# Patient Record
Sex: Female | Born: 2000 | Race: White | Hispanic: No | Marital: Single | Smoking: Never smoker
Health system: Southern US, Community
[De-identification: ages and names within clinical notes are randomized; demographics above are authoritative.]

---
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
## 2021-01-15 ENCOUNTER — Other Ambulatory Visit: Payer: Self-pay

## 2021-01-15 ENCOUNTER — Ambulatory Visit (INDEPENDENT_AMBULATORY_CARE_PROVIDER_SITE_OTHER): Payer: PRIVATE HEALTH INSURANCE | Admitting: Family Medicine

## 2021-01-15 ENCOUNTER — Encounter: Payer: Self-pay | Admitting: Family Medicine

## 2021-01-15 VITALS — BP 139/79 | HR 91 | Temp 99.4°F | Ht 63.5 in | Wt 161.2 lb

## 2021-01-15 DIAGNOSIS — Z7689 Persons encountering health services in other specified circumstances: Secondary | ICD-10-CM | POA: Diagnosis not present

## 2021-01-15 DIAGNOSIS — G43009 Migraine without aura, not intractable, without status migrainosus: Secondary | ICD-10-CM | POA: Diagnosis not present

## 2021-01-15 MED ORDER — RIZATRIPTAN BENZOATE 10 MG PO TABS: 10.0000 mg | ORAL_TABLET | ORAL | 6 refills | Status: AC | PRN

## 2021-01-15 NOTE — Patient Instructions (Signed)
Migraine Headache A migraine headache is an intense, throbbing pain on one side or both sides of the head. Migraine headaches may also cause other symptoms, such as nausea, vomiting, and sensitivity to light and noise. A migraine headache can last from 4 hours to 3 days. Talk with your doctor about what things may bring on (trigger) your migraine headaches. What are the causes? The exact cause of this condition is not known. However, a migraine may be caused when nerves in the brain become irritated and release chemicals that cause inflammation of blood vessels. This inflammation causes pain. This condition may be triggered or caused by:  Drinking alcohol.  Smoking.  Taking medicines, such as: ? Medicine used to treat chest pain (nitroglycerin). ? Birth control pills. ? Estrogen. ? Certain blood pressure medicines.  Eating or drinking products that contain nitrates, glutamate, aspartame, or tyramine. Aged cheeses, chocolate, or caffeine may also be triggers.  Doing physical activity. Other things that may trigger a migraine headache include:  Menstruation.  Pregnancy.  Hunger.  Stress.  Lack of sleep or too much sleep.  Weather changes.  Fatigue. What increases the risk? The following factors may make you more likely to experience migraine headaches:  Being a certain age. This condition is more common in people who are 25-55 years old.  Being female.  Having a family history of migraine headaches.  Being Caucasian.  Having a mental health condition, such as depression or anxiety.  Being obese. What are the signs or symptoms? The main symptom of this condition is pulsating or throbbing pain. This pain may:  Happen in any area of the head, such as on one side or both sides.  Interfere with daily activities.  Get worse with physical activity.  Get worse with exposure to bright lights or loud noises. Other symptoms may  include:  Nausea.  Vomiting.  Dizziness.  General sensitivity to bright lights, loud noises, or smells. Before you get a migraine headache, you may get warning signs (an aura). An aura may include:  Seeing flashing lights or having blind spots.  Seeing bright spots, halos, or zigzag lines.  Having tunnel vision or blurred vision.  Having numbness or a tingling feeling.  Having trouble talking.  Having muscle weakness. Some people have symptoms after a migraine headache (postdromal phase), such as:  Feeling tired.  Difficulty concentrating. How is this diagnosed? A migraine headache can be diagnosed based on:  Your symptoms.  A physical exam.  Tests, such as: ? CT scan or an MRI of the head. These imaging tests can help rule out other causes of headaches. ? Taking fluid from the spine (lumbar puncture) and analyzing it (cerebrospinal fluid analysis, or CSF analysis). How is this treated? This condition may be treated with medicines that:  Relieve pain.  Relieve nausea.  Prevent migraine headaches. Treatment for this condition may also include:  Acupuncture.  Lifestyle changes like avoiding foods that trigger migraine headaches.  Biofeedback.  Cognitive behavioral therapy. Follow these instructions at home: Medicines  Take over-the-counter and prescription medicines only as told by your health care provider.  Ask your health care provider if the medicine prescribed to you: ? Requires you to avoid driving or using heavy machinery. ? Can cause constipation. You may need to take these actions to prevent or treat constipation:  Drink enough fluid to keep your urine pale yellow.  Take over-the-counter or prescription medicines.  Eat foods that are high in fiber, such as beans, whole grains, and   fresh fruits and vegetables.  Limit foods that are high in fat and processed sugars, such as fried or sweet foods. Lifestyle  Do not drink alcohol.  Do not  use any products that contain nicotine or tobacco, such as cigarettes, e-cigarettes, and chewing tobacco. If you need help quitting, ask your health care provider.  Get at least 8 hours of sleep every night.  Find ways to manage stress, such as meditation, deep breathing, or yoga. General instructions  Keep a journal to find out what may trigger your migraine headaches. For example, write down: ? What you eat and drink. ? How much sleep you get. ? Any change to your diet or medicines.  If you have a migraine headache: ? Avoid things that make your symptoms worse, such as bright lights. ? It may help to lie down in a dark, quiet room. ? Do not drive or use heavy machinery. ? Ask your health care provider what activities are safe for you while you are experiencing symptoms.  Keep all follow-up visits as told by your health care provider. This is important.      Contact a health care provider if:  You develop symptoms that are different or more severe than your usual migraine headache symptoms.  You have more than 15 headache days in one month. Get help right away if:  Your migraine headache becomes severe.  Your migraine headache lasts longer than 72 hours.  You have a fever.  You have a stiff neck.  You have vision loss.  Your muscles feel weak or like you cannot control them.  You start to lose your balance often.  You have trouble walking.  You faint.  You have a seizure. Summary  A migraine headache is an intense, throbbing pain on one side or both sides of the head. Migraines may also cause other symptoms, such as nausea, vomiting, and sensitivity to light and noise.  This condition may be treated with medicines and lifestyle changes. You may also need to avoid certain things that trigger a migraine headache.  Keep a journal to find out what may trigger your migraine headaches.  Contact your health care provider if you have more than 15 headache days in a  month or you develop symptoms that are different or more severe than your usual migraine headache symptoms. This information is not intended to replace advice given to you by your health care provider. Make sure you discuss any questions you have with your health care provider. Document Revised: 03/12/2019 Document Reviewed: 12/31/2018 Elsevier Patient Education  2021 [employer] General Headache Without Cause A headache is pain or discomfort felt around the head or neck area. The specific cause of a headache may not be found. There are many causes and types of headaches. A few common ones are:  Tension headaches.  Migraine headaches.  Cluster headaches.  Chronic daily headaches. Follow these instructions at home: Watch your condition for any changes. Let your health care provider know about them. Take these steps to help with your condition: Managing pain  Take over-the-counter and prescription medicines only as told by your health care provider.  Lie down in a dark, quiet room when you have a headache.  If directed, put ice on your head and neck area: ? Put ice in a plastic bag. ? Place a towel between your skin and the bag. ? Leave the ice on for 20 minutes, 2-3 times per day.  If directed, apply heat to the affected  area. Use the heat source that your health care provider recommends, such as a moist heat pack or a heating pad. ? Place a towel between your skin and the heat source. ? Leave the heat on for 20-30 minutes. ? Remove the heat if your skin turns bright red. This is especially important if you are unable to feel pain, heat, or cold. You may have a greater risk of getting burned.  Keep lights dim if bright lights bother you or make your headaches worse.      Eating and drinking  Eat meals on a regular schedule.  If you drink alcohol: ? Limit how much you use to:  0-1 drink a day for women.  0-2 drinks a day for men. ? Be aware of how much alcohol is in your  drink. In the [place] one drink equals one 12 oz bottle of beer (355 mL), one 5 oz glass of wine (148 mL), or one 1 oz glass of hard liquor (44 mL).  Stop drinking caffeine, or decrease the amount of caffeine you drink. General instructions  Keep a headache journal to help find out what may trigger your headaches. For example, write down: ? What you eat and drink. ? How much sleep you get. ? Any change to your diet or medicines.  Try massage or other relaxation techniques.  Limit stress.  Sit up straight, and do not tense your muscles.  Do not use any products that contain nicotine or tobacco, such as cigarettes, e-cigarettes, and chewing tobacco. If you need help quitting, ask your health care provider.  Exercise regularly as told by your health care provider.  Sleep on a regular schedule. Get 7-9 hours of sleep each night, or the amount recommended by your health care provider.  Keep all follow-up visits as told by your health care provider. This is important.   Contact a health care provider if:  Your symptoms are not helped by medicine.  You have a headache that is different from the usual headache.  You have nausea or you vomit.  You have a fever. Get help right away if:  Your headache becomes severe quickly.  Your headache gets worse after moderate to intense physical activity.  You have repeated vomiting.  You have a stiff neck.  You have a loss of vision.  You have problems with speech.  You have pain in the eye or ear.  You have muscular weakness or loss of muscle control.  You lose your balance or have trouble walking.  You feel faint or pass out.  You have confusion.  You have a seizure. Summary  A headache is pain or discomfort felt around the head or neck area.  There are many causes and types of headaches. In some cases, the cause may not be found.  Keep a headache journal to help find out what may trigger your headaches. Watch your  condition for any changes. Let your health care provider know about them.  Contact a health care provider if you have a headache that is different from the usual headache, or if your symptoms are not helped by medicine.  Get help right away if your headache becomes severe, you vomit, you have a loss of vision, you lose your balance, or you have a seizure. This information is not intended to replace advice given to you by your health care provider. Make sure you discuss any questions you have with your health care provider. Document Revised: 06/08/2018 Document Reviewed: 06/08/2018  Elsevier Patient Education  2021 [employer]  

## 2021-01-15 NOTE — Progress Notes (Signed)
New Patient Office Visit  Subjective:  Patient ID: Tracy Tanner, female    DOB: August 14, 2001  Age: 20 y.o. MRN: [id number]  CC:  Chief Complaint  Patient presents with  . New Patient (Initial Visit)    [facility]  . Headache    Patient states that for the last 5 weeks she has been having headaches 2 times a week.    HPI Tracy Tanner presents for to establish care. She denies health problems. She has one concern today:  1. Headaches Trinette reports that she has had headaches twice a week for the last 5 weeks. The headaches are all over and feel like a pressure. She originally thought she was getting a sinus infection and started taking mucinex without relief. She was seen at urgent care and given ubrelvy without relief. She was also tested for Covid and this was negative. She did some left over prednisone and this was helpful. She reports that the severity of her headaches have decreased. She reports that her headaches are a 7 on the pain scale lately. They last all day. They are relieved by sleep. She denies nausea, vomiting, sensitivity to sleep or noise. She denies increased stress or changes in diet. She denies changes in vision, speech, syncope, changes in gait, or weakness.   History reviewed. No pertinent past medical history.  History reviewed. No pertinent surgical history.  Family History  Problem Relation Age of Onset  . Hypertension Father     Social History   Socioeconomic History  . Marital status: Single    Spouse name: Not on file  . Number of children: Not on file  . Years of education: Not on file  . Highest education level: Not on file  Occupational History  . Not on file  Tobacco Use  . Smoking status: Never Smoker  . Smokeless tobacco: Never Used  Vaping Use  . Vaping Use: Never used  Substance and Sexual Activity  . Alcohol use: Never  . Drug use: Never  . Sexual activity: Yes    Birth control/protection: Condom  Other Topics Concern   . Not on file  Social History Narrative  . Not on file   Social Determinants of Health   Financial Resource Strain: Not on file  Food Insecurity: Not on file  Transportation Needs: Not on file  Physical Activity: Not on file  Stress: Not on file  Social Connections: Not on file  Intimate Partner Violence: Not on file    ROS Review of Systems Negative unless specially indicated above in HPI.  Objective:   Today's Vitals: BP 139/79   Pulse 91   Temp 99.4 F (37.4 C) (Temporal)   Ht 5' 3.5" (1.613 m)   Wt 161 lb 3.2 oz (73.1 kg)   LMP 12/24/2020 (Approximate)   SpO2 99%   BMI 28.11 kg/m   Physical Exam Vitals and nursing note reviewed.  Constitutional:      General: She is not in acute distress.    Appearance: She is well-developed. She is not ill-appearing, toxic-appearing or diaphoretic.  HENT:     Head: Normocephalic and atraumatic.     Mouth/Throat:     Mouth: Mucous membranes are moist.     Pharynx: Oropharynx is clear.  Eyes:     General: No scleral icterus.    Extraocular Movements: Extraocular movements intact.     Right eye: Normal extraocular motion and no nystagmus.     Left eye: Normal extraocular  motion and no nystagmus.     Pupils: Pupils are equal, round, and reactive to light.  Cardiovascular:     Rate and Rhythm: Normal rate and regular rhythm.     Heart sounds: Normal heart sounds. No murmur heard.   Pulmonary:     Effort: Pulmonary effort is normal.     Breath sounds: Normal breath sounds.  Musculoskeletal:        General: Normal range of motion.     Cervical back: Normal range of motion and neck supple. No rigidity.  Lymphadenopathy:     Cervical: No cervical adenopathy.  Neurological:     Mental Status: She is alert and oriented to person, place, and time.     Cranial Nerves: No facial asymmetry.     Motor: No weakness.     Gait: Gait normal.  Psychiatric:        Speech: Speech normal.        Behavior: Behavior normal.      Assessment & Plan:   Mickey was seen today for new patient (initial visit) and headache.  Diagnoses and all orders for this visit:  Migraine without aura and without status migrainosus, not intractable ? Migraines. Will try rizatriptan. Discussed headache diary as well. Return to office for new or worsening symptoms, or if symptoms persist.  -     rizatriptan (MAXALT) 10 MG tablet; Take 1 tablet (10 mg total) by mouth as needed for migraine. May repeat in 2 hours if needed  Encounter to establish care Awaiting records from previous PCP.    Follow-up: Return in about 4 weeks (around 02/12/2021) for CPE.   The patient indicates understanding of these issues and agrees with the plan.   Gabriel Earing, FNP

## 2021-02-26 ENCOUNTER — Encounter: Payer: PRIVATE HEALTH INSURANCE | Admitting: Family Medicine

## 2021-03-26 ENCOUNTER — Other Ambulatory Visit: Payer: Self-pay

## 2021-03-26 ENCOUNTER — Other Ambulatory Visit (HOSPITAL_COMMUNITY)
Admission: RE | Admit: 2021-03-26 | Discharge: 2021-03-26 | Disposition: A | Discharge status: Home / Self Care | Payer: PRIVATE HEALTH INSURANCE | Source: Ambulatory Visit | Attending: Family Medicine | Admitting: Family Medicine

## 2021-03-26 ENCOUNTER — Ambulatory Visit (INDEPENDENT_AMBULATORY_CARE_PROVIDER_SITE_OTHER): Payer: PRIVATE HEALTH INSURANCE | Admitting: Family Medicine

## 2021-03-26 ENCOUNTER — Encounter: Payer: Self-pay | Admitting: Family Medicine

## 2021-03-26 VITALS — BP 116/69 | HR 75 | Temp 98.2°F | Ht 63.5 in | Wt 159.0 lb

## 2021-03-26 DIAGNOSIS — Z0001 Encounter for general adult medical examination with abnormal findings: Secondary | ICD-10-CM

## 2021-03-26 DIAGNOSIS — Z6827 Body mass index (BMI) 27.0-27.9, adult: Secondary | ICD-10-CM | POA: Diagnosis not present

## 2021-03-26 DIAGNOSIS — Z113 Encounter for screening for infections with a predominantly sexual mode of transmission: Secondary | ICD-10-CM

## 2021-03-26 DIAGNOSIS — Z1159 Encounter for screening for other viral diseases: Secondary | ICD-10-CM | POA: Diagnosis not present

## 2021-03-26 DIAGNOSIS — Z0000 Encounter for general adult medical examination without abnormal findings: Secondary | ICD-10-CM

## 2021-03-26 DIAGNOSIS — G43009 Migraine without aura, not intractable, without status migrainosus: Secondary | ICD-10-CM | POA: Insufficient documentation

## 2021-03-26 DIAGNOSIS — R221 Localized swelling, mass and lump, neck: Secondary | ICD-10-CM

## 2021-03-26 LAB — CMP14+EGFR

## 2021-03-26 LAB — CBC WITH DIFFERENTIAL/PLATELET
Basos: 1 %
Hematocrit: 43.1 % (ref 34.0–46.6)
Hemoglobin: 14.4 g/dL (ref 11.1–15.9)
MCHC: 33.4 g/dL (ref 31.5–35.7)
MCV: 91 fL (ref 79–97)
RBC: 4.72 x10E6/uL (ref 3.77–5.28)
WBC: 7.4 10*3/uL (ref 3.4–10.8)

## 2021-03-26 LAB — LIPID PANEL

## 2021-03-26 LAB — TSH

## 2021-03-26 LAB — HIV ANTIBODY (ROUTINE TESTING W REFLEX)

## 2021-03-26 LAB — VITAMIN D 25 HYDROXY (VIT D DEFICIENCY, FRACTURES)

## 2021-03-26 NOTE — Patient Instructions (Signed)
Health Maintenance, Female Adopting a healthy lifestyle and getting preventive care are important in promoting health and wellness. Ask your health care provider about:  The right schedule for you to have regular tests and exams.  Things you can do on your own to prevent diseases and keep yourself healthy. What should I know about diet, weight, and exercise? Eat a healthy diet  Eat a diet that includes plenty of vegetables, fruits, low-fat dairy products, and lean protein.  Do not eat a lot of foods that are high in solid fats, added sugars, or sodium.   Maintain a healthy weight Body mass index (BMI) is used to identify weight problems. It estimates body fat based on height and weight. Your health care provider can help determine your BMI and help you achieve or maintain a healthy weight. Get regular exercise Get regular exercise. This is one of the most important things you can do for your health. Most adults should:  Exercise for at least 150 minutes each week. The exercise should increase your heart rate and make you sweat (moderate-intensity exercise).  Do strengthening exercises at least twice a week. This is in addition to the moderate-intensity exercise.  Spend less time sitting. Even light physical activity can be beneficial. Watch cholesterol and blood lipids Have your blood tested for lipids and cholesterol at 20 years of age, then have this test every 5 years. Have your cholesterol levels checked more often if:  Your lipid or cholesterol levels are high.  You are older than 19 years of age.  You are at high risk for heart disease. What should I know about cancer screening? Depending on your health history and family history, you may need to have cancer screening at various ages. This may include screening for:  Breast cancer.  Cervical cancer.  Colorectal cancer.  Skin cancer.  Lung cancer. What should I know about heart disease, diabetes, and high blood  pressure? Blood pressure and heart disease  High blood pressure causes heart disease and increases the risk of stroke. This is more likely to develop in people who have high blood pressure readings, are of African descent, or are overweight.  Have your blood pressure checked: ? Every 3-5 years if you are 18-39 years of age. ? Every year if you are 40 years old or older. Diabetes Have regular diabetes screenings. This checks your fasting blood sugar level. Have the screening done:  Once every three years after age 40 if you are at a normal weight and have a low risk for diabetes.  More often and at a younger age if you are overweight or have a high risk for diabetes. What should I know about preventing infection? Hepatitis B If you have a higher risk for hepatitis B, you should be screened for this virus. Talk with your health care provider to find out if you are at risk for hepatitis B infection. Hepatitis C Testing is recommended for:  Everyone born from 1945 through 1965.  Anyone with known risk factors for hepatitis C. Sexually transmitted infections (STIs)  Get screened for STIs, including gonorrhea and chlamydia, if: ? You are sexually active and are younger than 19 years of age. ? You are older than 19 years of age and your health care provider tells you that you are at risk for this type of infection. ? Your sexual activity has changed since you were last screened, and you are at increased risk for chlamydia or gonorrhea. Ask your health care provider   if you are at risk.  Ask your health care provider about whether you are at high risk for HIV. Your health care provider may recommend a prescription medicine to help prevent HIV infection. If you choose to take medicine to prevent HIV, you should first get tested for HIV. You should then be tested every 3 months for as long as you are taking the medicine. Pregnancy  If you are about to stop having your period (premenopausal) and  you may become pregnant, seek counseling before you get pregnant.  Take 400 to 800 micrograms (mcg) of folic acid every day if you become pregnant.  Ask for birth control (contraception) if you want to prevent pregnancy. Osteoporosis and menopause Osteoporosis is a disease in which the bones lose minerals and strength with aging. This can result in bone fractures. If you are 65 years old or older, or if you are at risk for osteoporosis and fractures, ask your health care provider if you should:  Be screened for bone loss.  Take a calcium or vitamin D supplement to lower your risk of fractures.  Be given hormone replacement therapy (HRT) to treat symptoms of menopause. Follow these instructions at home: Lifestyle  Do not use any products that contain nicotine or tobacco, such as cigarettes, e-cigarettes, and chewing tobacco. If you need help quitting, ask your health care provider.  Do not use street drugs.  Do not share needles.  Ask your health care provider for help if you need support or information about quitting drugs. Alcohol use  Do not drink alcohol if: ? Your health care provider tells you not to drink. ? You are pregnant, may be pregnant, or are planning to become pregnant.  If you drink alcohol: ? Limit how much you use to 0-1 drink a day. ? Limit intake if you are breastfeeding.  Be aware of how much alcohol is in your drink. In the [place] one drink equals one 12 oz bottle of beer (355 mL), one 5 oz glass of wine (148 mL), or one 1 oz glass of hard liquor (44 mL). General instructions  Schedule regular health, dental, and eye exams.  Stay current with your vaccines.  Tell your health care provider if: ? You often feel depressed. ? You have ever been abused or do not feel safe at home. Summary  Adopting a healthy lifestyle and getting preventive care are important in promoting health and wellness.  Follow your health care provider's instructions about healthy  diet, exercising, and getting tested or screened for diseases.  Follow your health care provider's instructions on monitoring your cholesterol and blood pressure. This information is not intended to replace advice given to you by your health care provider. Make sure you discuss any questions you have with your health care provider. Document Revised: 11/11/2018 Document Reviewed: 11/11/2018 Elsevier Patient Education  2021 Elsevier Inc.  

## 2021-03-26 NOTE — Progress Notes (Signed)
Tracy Tanner is a 20 y.o. female presents to office today for annual physical exam examination.    Concerns today include: 1. Small lump on right side of neck. She noticed this amount 1 week ago. It is not red or tender. It is movable. She denies recent illness. Denies fever or unintended weight loss.   Maxalt has worked well for her migraines. She has only had 1 migraine in the last 2 months.   Occupation: works at [employer], in school with [employer], will be going to [employer] in the fall Marital status: single, Substance use: denies Diet: well balanced, Exercise: treadmill, strength training 1-2x a week Last eye exam: 6 months ago Last dental exam: 3 weeks ago  History reviewed. No pertinent past medical history. Social History   Socioeconomic History  . Marital status: Single    Spouse name: Not on file  . Number of children: Not on file  . Years of education: Not on file  . Highest education level: Not on file  Occupational History  . Not on file  Tobacco Use  . Smoking status: Never Smoker  . Smokeless tobacco: Never Used  Vaping Use  . Vaping Use: Never used  Substance and Sexual Activity  . Alcohol use: Never  . Drug use: Never  . Sexual activity: Yes    Birth control/protection: Condom  Other Topics Concern  . Not on file  Social History Narrative  . Not on file   Social Determinants of Health   Financial Resource Strain: Not on file  Food Insecurity: Not on file  Transportation Needs: Not on file  Physical Activity: Not on file  Stress: Not on file  Social Connections: Not on file  Intimate Partner Violence: Not on file   No past surgical history on file. Family History  Problem Relation Age of Onset  . Hypertension Father     Current Outpatient Medications:  .  rizatriptan (MAXALT) 10 MG tablet, Take 1 tablet (10 mg total) by mouth as needed for migraine. May repeat in 2 hours if needed, Disp: 10 tablet, Rfl: 6  No Known Allergies   ROS: Review  of Systems Pertinent items noted in HPI and remainder of comprehensive ROS otherwise negative.    Physical exam BP 116/69   Pulse 75   Temp 98.2 F (36.8 C) (Temporal)   Ht 5' 3.5" (1.613 m)   Wt 159 lb (72.1 kg)   BMI 27.72 kg/m  General appearance: alert, cooperative and no distress Head: Normocephalic, without obvious abnormality, atraumatic Eyes: conjunctivae/corneas clear. PERRL, EOM's intact.  Ears: normal TM's and external ear canals both ears Nose: Nares normal. Septum midline. Mucosa normal. No drainage or sinus tenderness. Throat: lips, mucosa, and tongue normal; teeth and gums normal Neck: no JVD, supple, symmetrical, trachea midline and thyroid not enlarged, symmetric, soft, moveable pea size mass on right lateral neck, no erythema or tenderness Lungs: clear to auscultation bilaterally Heart: regular rate and rhythm, S1, S2 normal, no murmur, click, rub or gallop Abdomen: soft, non-tender; bowel sounds normal; no masses,  no organomegaly Extremities: extremities normal, atraumatic, no cyanosis or edema Skin: Skin color, texture, turgor normal. No rashes or lesions Neurologic: Alert and oriented X 3, normal strength and tone. Normal symmetric reflexes. Normal coordination and gait    Assessment/ Plan: Chrystine Oiler here for annual physical exam.   Diagnoses and all orders for this visit:  Routine general medical examination at a health care facility Labs pending as below.  -  TSH -     Vitamin B12 -     VITAMIN D 25 Hydroxy (Vit-D Deficiency, Fractures) -     Lipid panel -     CBC with Differential/Platelet -     CMP14+EGFR  BMI 27.0-27.9,adult Diet and exercise. Labs pending as below.  -     TSH -     Lipid panel -     CBC with Differential/Platelet -     CMP14+EGFR  Screening examination for STD (sexually transmitted disease) Labs pending as below.  -     Urine cytology ancillary only -     HIV Antibody (routine testing w rflx)  Need for  hepatitis C screening test -     Hepatitis C antibody  Migraine without aura and without status migrainosus, not intractable Well controlled on current regimen.   Neck mass Pea size, soft, moveable, non tender. Discussed enlarged lymph nodes vs lipoma. Labs pending as below. Return to office for new or worsening symptoms, or if symptoms persist.        -     CBC with Differential/Platelet   Counseled on healthy lifestyle choices, including diet (rich in fruits, vegetables and lean meats and low in salt and simple carbohydrates) and exercise (at least 30 minutes of moderate physical activity daily).  Patient to follow up in 1 year for annual exam or sooner if needed.  The above assessment and management plan was discussed with the patient. The patient verbalized understanding of and has agreed to the management plan. Patient is aware to call the clinic if symptoms persist or worsen. Patient is aware when to return to the clinic for a follow-up visit. Patient educated on when it is appropriate to go to the emergency department.   Marjorie Smolder, FNP-C [facility] [address] [place], [place] [address] [phone]

## 2021-03-27 LAB — CBC WITH DIFFERENTIAL/PLATELET
Basophils Absolute: 0 10*3/uL (ref 0.0–0.2)
EOS (ABSOLUTE): 0.1 10*3/uL (ref 0.0–0.4)
Eos: 1 %
Immature Grans (Abs): 0 10*3/uL (ref 0.0–0.1)
Immature Granulocytes: 0 %
Lymphocytes Absolute: 2.3 10*3/uL (ref 0.7–3.1)
Lymphs: 31 %
MCH: 30.5 pg (ref 26.6–33.0)
Monocytes Absolute: 0.7 10*3/uL (ref 0.1–0.9)
Monocytes: 9 %
Neutrophils Absolute: 4.3 10*3/uL (ref 1.4–7.0)
Neutrophils: 58 %
Platelets: 368 10*3/uL (ref 150–450)
RDW: 12.5 % (ref 11.7–15.4)

## 2021-03-27 LAB — LIPID PANEL
Cholesterol, Total: 162 mg/dL (ref 100–169)
HDL: 54 mg/dL (ref >39)
LDL Chol Calc (NIH): 98 mg/dL (ref 0–109)
Triglycerides: 50 mg/dL (ref 0–89)
VLDL Cholesterol Cal: 10 mg/dL (ref 5–40)

## 2021-03-27 LAB — CMP14+EGFR
ALT: 10 IU/L (ref 0–32)
Alkaline Phosphatase: 105 IU/L (ref 42–106)
BUN/Creatinine Ratio: 13 (ref 9–23)
CO2: 20 mmol/L (ref 20–29)
Calcium: 10.3 mg/dL — ABNORMAL HIGH (ref 8.7–10.2)
Creatinine, Ser: 0.86 mg/dL (ref 0.57–1.00)
Globulin, Total: 1.9 g/dL (ref 1.5–4.5)
Glucose: 86 mg/dL (ref 65–99)
Potassium: 4.5 mmol/L (ref 3.5–5.2)
Sodium: 142 mmol/L (ref 134–144)
Total Protein: 6.9 g/dL (ref 6.0–8.5)
eGFR: 100 mL/min/{1.73_m2} (ref >59)

## 2021-03-27 LAB — HEPATITIS C ANTIBODY: Hep C Virus Ab: <0.1 s/co ratio (ref 0.0–0.9)

## 2021-03-27 LAB — URINE CYTOLOGY ANCILLARY ONLY
Chlamydia: NEGATIVE
Comment: NEGATIVE
Comment: NEGATIVE
Comment: NORMAL
Neisseria Gonorrhea: NEGATIVE
Trichomonas: NEGATIVE

## 2021-03-27 LAB — VITAMIN B12: Vitamin B-12: 490 pg/mL (ref 232–1245)

## 2021-06-14 ENCOUNTER — Telehealth: Payer: PRIVATE HEALTH INSURANCE | Admitting: Physician Assistant

## 2021-06-14 DIAGNOSIS — J020 Streptococcal pharyngitis: Secondary | ICD-10-CM | POA: Diagnosis not present

## 2021-06-14 MED ORDER — CEFDINIR 300 MG PO CAPS: 300.0000 mg | ORAL_CAPSULE | Freq: Two times a day (BID) | ORAL | 0 refills | Status: DC

## 2021-06-14 MED ORDER — AMOXICILLIN 500 MG PO CAPS
500.0000 mg | ORAL_CAPSULE | Freq: Two times a day (BID) | ORAL | 0 refills | Status: DC
Start: 2021-06-14 — End: 2021-06-14

## 2021-06-14 NOTE — Progress Notes (Signed)

## 2021-06-14 NOTE — Addendum Note (Signed)
Addended by: Margaretann Loveless on: 06/14/2021 09:33 AM   Modules accepted: Orders

## 2021-12-04 ENCOUNTER — Ambulatory Visit (INDEPENDENT_AMBULATORY_CARE_PROVIDER_SITE_OTHER): Payer: 59 | Admitting: Nurse Practitioner

## 2021-12-04 ENCOUNTER — Encounter: Payer: Self-pay | Admitting: Nurse Practitioner

## 2021-12-04 VITALS — BP 125/72 | HR 77 | Temp 98.1°F | Ht 63.5 in | Wt 154.4 lb

## 2021-12-04 DIAGNOSIS — R1031 Right lower quadrant pain: Secondary | ICD-10-CM | POA: Diagnosis not present

## 2021-12-04 MED ORDER — CYCLOBENZAPRINE HCL 5 MG PO TABS: 5.0000 mg | ORAL_TABLET | Freq: Three times a day (TID) | ORAL | 1 refills | Status: DC | PRN

## 2021-12-04 NOTE — Progress Notes (Signed)
Acute Office Visit  Subjective:    Patient ID: Tracy Tanner, female    DOB: 2001/02/08, 21 y.o.   MRN: [id number]  Chief Complaint  Patient presents with   Groin Pain    Groin Pain The patient's primary symptoms include pelvic pain. This is a recurrent problem. The current episode started more than 1 month ago. The problem occurs intermittently. The problem has been unchanged. The pain is moderate. The problem affects the right side. She is not pregnant. Pertinent negatives include no abdominal pain, constipation, flank pain, headaches or nausea. Nothing aggravates the symptoms. She has tried NSAIDs for the symptoms. The treatment provided mild relief. It is unknown whether or not her partner has an STD. She uses nothing for contraception. Her menstrual history has been regular. There is no history of PID.    History reviewed. No pertinent past medical history.  History reviewed. No pertinent surgical history.  Family History  Problem Relation Age of Onset   Hypertension Father     Social History   Socioeconomic History   Marital status: Single    Spouse name: Not on file   Number of children: Not on file   Years of education: Not on file   Highest education level: Not on file  Occupational History   Not on file  Tobacco Use   Smoking status: Never   Smokeless tobacco: Never  Vaping Use   Vaping Use: Never used  Substance and Sexual Activity   Alcohol use: Never   Drug use: Never   Sexual activity: Yes    Birth control/protection: Condom  Other Topics Concern   Not on file  Social History Narrative   Not on file   Social Determinants of Health   Financial Resource Strain: Not on file  Food Insecurity: Not on file  Transportation Needs: Not on file  Physical Activity: Not on file  Stress: Not on file  Social Connections: Not on file  Intimate Partner Violence: Not on file    Outpatient Medications Prior to Visit  Medication Sig Dispense Refill    rizatriptan (MAXALT) 10 MG tablet Take 1 tablet (10 mg total) by mouth as needed for migraine. May repeat in 2 hours if needed 10 tablet 6   cefdinir (OMNICEF) 300 MG capsule Take 1 capsule (300 mg total) by mouth 2 (two) times daily. 20 capsule 0   No facility-administered medications prior to visit.    No Known Allergies  Review of Systems  Constitutional: Negative.   HENT: Negative.    Eyes: Negative.   Gastrointestinal:  Negative for abdominal pain, constipation and nausea.  Genitourinary:  Positive for pelvic pain. Negative for flank pain.  Neurological:  Negative for headaches.  All other systems reviewed and are negative.     Objective:    Physical Exam Vitals and nursing note reviewed.  Constitutional:      Appearance: Normal appearance. She is normal weight.  HENT:     Right Ear: External ear normal.     Left Ear: External ear normal.     Nose: Nose normal.     Mouth/Throat:     Mouth: Mucous membranes are moist.     Pharynx: Oropharynx is clear.  Eyes:     Conjunctiva/sclera: Conjunctivae normal.  Cardiovascular:     Rate and Rhythm: Normal rate and regular rhythm.     Pulses: Normal pulses.     Heart sounds: Normal heart sounds.  Pulmonary:     Effort: Pulmonary effort  is normal.     Breath sounds: Normal breath sounds.  Abdominal:     General: Bowel sounds are normal.  Musculoskeletal:     Cervical back: Normal range of motion.       Legs:     Comments: Right groin pain  Skin:    General: Skin is warm.     Findings: No rash.  Neurological:     Mental Status: She is alert and oriented to person, place, and time.  Psychiatric:        Behavior: Behavior normal.    BP 125/72    Pulse 77    Temp 98.1 F (36.7 C) (Temporal)    Ht 5' 3.5" (1.613 m)    Wt 154 lb 6 oz (70 kg)    BMI 26.92 kg/m  Wt Readings from Last 3 Encounters:  12/04/21 154 lb 6 oz (70 kg) (84 %, Z= 1.01)*  03/26/21 159 lb (72.1 kg) (88 %, Z= 1.19)*  01/15/21 161 lb 3.2 oz (73.1 kg)  (90 %, Z= 1.26)*   * Growth percentiles are based on CDC (Girls, 2-20 Years) data.    There are no preventive care reminders to display for this patient.  There are no preventive care reminders to display for this patient.   Lab Results  Component Value Date   TSH 0.883 03/26/2021   Lab Results  Component Value Date   WBC 7.4 03/26/2021   HGB 14.4 03/26/2021   HCT 43.1 03/26/2021   MCV 91 03/26/2021   PLT 368 03/26/2021   Lab Results  Component Value Date   NA 142 03/26/2021   K 4.5 03/26/2021   CO2 20 03/26/2021   GLUCOSE 86 03/26/2021   BUN 11 03/26/2021   CREATININE 0.86 03/26/2021   BILITOT <0.2 03/26/2021   ALKPHOS 105 03/26/2021   AST 13 03/26/2021   ALT 10 03/26/2021   PROT 6.9 03/26/2021   ALBUMIN 5.0 03/26/2021   CALCIUM 10.3 (H) 03/26/2021   ANIONGAP 13 05/02/2016   EGFR 100 03/26/2021   Lab Results  Component Value Date   CHOL 162 03/26/2021   Lab Results  Component Value Date   HDL 54 03/26/2021   Lab Results  Component Value Date   LDLCALC 98 03/26/2021   Lab Results  Component Value Date   TRIG 50 03/26/2021   Lab Results  Component Value Date   CHOLHDL 3.0 03/26/2021   No results found for: HGBA1C     Assessment & Plan:   Problem List Items Addressed This Visit       Other   Groin pain, right - Primary    Right groin pain, symptoms not well controlled in the past few months. Symptoms are worse with walking which walking has increased since going to college, NSAID  Has moderately helped. Advised patient to rest joint, continue NSAID,, Ultrasound of the right groin completed results pending. Flexeril 5 mg tablet by mouth as needed , ice or warm compress. Follow up with worsening unresolved symptoms. Education provided with printed hand out given.  RE sent to pharmacy,       Relevant Medications   cyclobenzaprine (FLEXERIL) 5 MG tablet   Other Relevant Orders   [place] RT LOWER EXTREM LTD SOFT TISSUE NON VASCULAR     Meds  ordered this encounter  Medications   cyclobenzaprine (FLEXERIL) 5 MG tablet    Sig: Take 1 tablet (5 mg total) by mouth 3 (three) times daily as needed for muscle spasms.  Dispense:  30 tablet    Refill:  1    Order Specific Question:   Supervising Provider    Answer:   Jeneen Rinks     Ivy Lynn, NP

## 2021-12-04 NOTE — Patient Instructions (Signed)
Adductor Muscle Strain °An adductor muscle strain, also called a groin strain or pull, is an injury to the muscles or tendons on the upper, inner part of the thigh. These muscles are called the adductor or groin muscles. They are responsible for moving the legs across the body or pulling the legs together. °A muscle strain occurs when a muscle is overstretched and some muscle fibers are torn. The severity of an adductor muscle strain is rated as Grade 1, 2, or 3. A Grade 3 strain has the most tearing and pain. °What are the causes? °Adductor muscle strains usually occur during exercise or while participating in sports. °This condition may be caused by: °A sudden, violent force placed on the muscle, stretching it too far. °Stretching the muscles too far or too suddenly, often during side-to-side motion with a sudden change in direction. °Putting repeated stress on the adductor muscles over a long period of time. °Performing vigorous activity without properly stretching or warming up the adductor muscles beforehand. °Not being properly conditioned. °What are the signs or symptoms? °Symptoms of this condition include: °Pain and tenderness in the groin area. This begins as sharp pain and persists as a dull ache. °A popping or snapping feeling when the injury occurs (for severe strains). °Swelling or bruising. °Muscle spasms. °Weakness in the leg. °Stiffness in the groin area with decreased ability to move the affected muscles. °How is this diagnosed? °This condition may be diagnosed based on: °A physical exam. °Your medical history. °How well you can do certain range of motion exercises. °Imaging tests, such as MRI, ultrasound, or X-rays. °Your strain may be rated based on how severe it is. The ratings are: °Grade 1 strain (mild). Muscles are overstretched. There may be very small muscle tears. This type of strain generally heals in about one week. °Grade 2 strain (moderate). Muscles are partially torn. This may take  one to two months to heal. °Grade 3 strain (severe). Muscles are completely torn. A severe strain can take more than three months to heal. Grade 3 gluteal strains are rare. °How is this treated? °An adductor strain will often heal on its own. If needed, this condition may be treated with: °PRICE therapy. PRICE stands for protection of the injured area, rest, ice, pressure (compression), and elevation. °Medicines to help manage pain and swelling (anti-inflammatory medicines). °Crutches. You may be directed to use these for the first few days to minimize your pain. °Depending on the severity of the muscle strain, recovery time may vary from a few weeks to several months. Severe injuries often require 4-6 weeks for recovery. In those cases, complete healing can take 4-5 months. °Follow these instructions at home: °PRICE Therapy ° °Protect the muscle from being injured again. °Rest. Do not use the strained muscle if it causes pain. °If directed, put ice on the injured area: °Put ice in a plastic bag. °Place a towel between your skin and the bag. °Leave the ice on for 20 minutes, 2-3 times a day. Do this for the first 2 days after the injury. °Apply compression by wrapping the injured area with an elastic bandage as told by your health care provider. °Raise (elevate) the injured area above the level of your heart while you are sitting or lying down. °Activity °Do not drive or use heavy machinery while taking prescription pain medicine. °Walk, stretch, and do exercises as told by your health care provider. Only do these activities if you can do so without any pain. °General instructions °Take   over-the-counter and prescription medicines only as told by your health care provider. °Follow your treatment plan as told by your health care provider. This may include: °Physical therapy. °Massage. °Local electrical stimulation (transcutaneous electrical nerve stimulation, TENS). °Keep all follow-up visits. This is important. °How  is this prevented? °Warm up and stretch before being active. °Cool down and stretch after being active. °Give your body time to rest between periods of activity. °Make sure to use equipment that fits you. °Be safe and responsible while being active to avoid slips and falls. °Maintain physical fitness, including: °Proper conditioning in the adductor muscles. °Overall strength, flexibility, and endurance. °Contact a health care provider if: °You have increased pain or swelling in the affected area. °Your symptoms are not improving or they are getting worse. °Summary °An adductor muscle strain, also called a groin strain or pull, is an injury to the muscles or tendons on the upper, inner part of the thigh. °A muscle strain occurs when a muscle is overstretched and some muscle fibers are torn. °Depending on the severity of the muscle strain, recovery time may vary from a few weeks to several months. °This information is not intended to replace advice given to you by your health care provider. Make sure you discuss any questions you have with your health care provider. °Document Revised: 04/18/2021 Document Reviewed: 04/18/2021 °Elsevier Patient Education © 2022 [employer] ° °

## 2021-12-04 NOTE — Assessment & Plan Note (Signed)
Right groin pain, symptoms not well controlled in the past few months. Symptoms are worse with walking which walking has increased since going to college, NSAID  Has moderately helped. Advised patient to rest joint, continue NSAID,, Ultrasound of the right groin completed results pending. Flexeril 5 mg tablet by mouth as needed , ice or warm compress. Follow up with worsening unresolved symptoms. Education provided with printed hand out given.  RE sent to pharmacy,

## 2021-12-25 ENCOUNTER — Telehealth: Payer: Self-pay | Admitting: Family Medicine

## 2022-01-09 ENCOUNTER — Ambulatory Visit (HOSPITAL_COMMUNITY): Payer: 59

## 2022-06-19 ENCOUNTER — Encounter: Payer: Self-pay | Admitting: Family Medicine

## 2022-06-19 ENCOUNTER — Ambulatory Visit (INDEPENDENT_AMBULATORY_CARE_PROVIDER_SITE_OTHER): Payer: 59 | Admitting: Family Medicine

## 2022-06-19 ENCOUNTER — Other Ambulatory Visit (HOSPITAL_COMMUNITY)
Admission: RE | Admit: 2022-06-19 | Discharge: 2022-06-19 | Disposition: A | Discharge status: Home / Self Care | Payer: 59 | Source: Ambulatory Visit | Attending: Family Medicine | Admitting: Family Medicine

## 2022-06-19 VITALS — BP 118/75 | HR 70 | Temp 97.0°F | Ht 63.5 in | Wt 146.0 lb

## 2022-06-19 DIAGNOSIS — R300 Dysuria: Secondary | ICD-10-CM

## 2022-06-19 DIAGNOSIS — N898 Other specified noninflammatory disorders of vagina: Secondary | ICD-10-CM | POA: Diagnosis not present

## 2022-06-19 LAB — URINALYSIS, ROUTINE W REFLEX MICROSCOPIC
Bilirubin, UA: NEGATIVE
Glucose, UA: NEGATIVE
Ketones, UA: NEGATIVE
Leukocytes,UA: NEGATIVE
Nitrite, UA: NEGATIVE
Protein,UA: NEGATIVE
Specific Gravity, UA: 1.025 (ref 1.005–1.030)
Urobilinogen, Ur: 0.2 mg/dL (ref 0.2–1.0)
pH, UA: 5.5 (ref 5.0–7.5)

## 2022-06-19 LAB — MICROSCOPIC EXAMINATION
Bacteria, UA: NONE SEEN
Renal Epithel, UA: NONE SEEN /hpf

## 2022-06-19 LAB — WET PREP FOR TRICH, YEAST, CLUE
Clue Cell Exam: NEGATIVE
Trichomonas Exam: NEGATIVE
Yeast Exam: NEGATIVE

## 2022-06-19 NOTE — Patient Instructions (Signed)

## 2022-06-19 NOTE — Progress Notes (Signed)
Tracy Tanner, Tracy Bogus, FNP Chief Complaint  Patient presents with   Dysuria    Current Issues:  Presents with 7 days of dysuria, frequency, and foul odor. Dysuria is intermittent. Associated symptoms include:   increased vaginal discharge- white  There is no history of of similar symptoms. Sexually active:  Yes  No concern for STI.  She has increased hydration without improvement. She started her menstrual cycle yesterday.  Prior to Admission medications   Medication Sig Start Date End Date Taking? Authorizing Provider  cyclobenzaprine (FLEXERIL) 5 MG tablet Take 1 tablet (5 mg total) by mouth 3 (three) times daily as needed for muscle spasms. 12/04/21  Yes Ivy Lynn, NP  rizatriptan (MAXALT) 10 MG tablet Take 1 tablet (10 mg total) by mouth as needed for migraine. May repeat in 2 hours if needed 01/15/21  Yes Gwenlyn Perking, FNP    Review of Systems  Constitutional:  Negative for chills and fever.  Gastrointestinal:  Negative for abdominal pain, nausea and vomiting.  Genitourinary:  Positive for dysuria and frequency. Negative for flank pain, hematuria and urgency.     PE:  BP 118/75   Pulse 70   Temp (!) 97 F (36.1 C) (Oral)   Ht 5' 3.5" (1.613 m)   Wt 146 lb (66.2 kg)   SpO2 99%   BMI 25.46 kg/m  Physical Exam Vitals and nursing note reviewed.  Constitutional:      General: She is not in acute distress.    Appearance: She is not ill-appearing, toxic-appearing or diaphoretic.  Pulmonary:     Effort: Pulmonary effort is normal. No respiratory distress.  Abdominal:     General: There is no distension.     Palpations: Abdomen is soft.     Tenderness: There is no abdominal tenderness. There is no right CVA tenderness, left CVA tenderness, guarding or rebound.  Musculoskeletal:     Right lower leg: No edema.     Left lower leg: No edema.  Skin:    General: Skin is warm and dry.  Neurological:     General: No focal deficit present.     Mental Status: She  is alert and oriented to person, place, and time.  Psychiatric:        Mood and Affect: Mood normal.        Behavior: Behavior normal.      Results for orders placed or performed in visit on 03/26/21  TSH  Result Value Ref Range   TSH 0.883 0.450 - 4.500 uIU/mL  Vitamin B12  Result Value Ref Range   Vitamin B-12 490 232 - 1,245 pg/mL  VITAMIN D 25 Hydroxy (Vit-D Deficiency, Fractures)  Result Value Ref Range   Vit D, 25-Hydroxy 36.6 30.0 - 100.0 ng/mL  Lipid panel  Result Value Ref Range   Cholesterol, Total 162 100 - 169 mg/dL   Triglycerides 50 0 - 89 mg/dL   HDL 54 >39 mg/dL   VLDL Cholesterol Cal 10 5 - 40 mg/dL   LDL Chol Calc (NIH) 98 0 - 109 mg/dL   Chol/HDL Ratio 3.0 0.0 - 4.4 ratio  CBC with Differential/Platelet  Result Value Ref Range   WBC 7.4 3.4 - 10.8 x10E3/uL   RBC 4.72 3.77 - 5.28 x10E6/uL   Hemoglobin 14.4 11.1 - 15.9 g/dL   Hematocrit 43.1 34.0 - 46.6 %   MCV 91 79 - 97 fL   MCH 30.5 26.6 - 33.0 pg   MCHC 33.4 31.5 - 35.7  g/dL   RDW 12.5 11.7 - 15.4 %   Platelets 368 150 - 450 x10E3/uL   Neutrophils 58 Not Estab. %   Lymphs 31 Not Estab. %   Monocytes 9 Not Estab. %   Eos 1 Not Estab. %   Basos 1 Not Estab. %   Neutrophils Absolute 4.3 1.4 - 7.0 x10E3/uL   Lymphocytes Absolute 2.3 0.7 - 3.1 x10E3/uL   Monocytes Absolute 0.7 0.1 - 0.9 x10E3/uL   EOS (ABSOLUTE) 0.1 0.0 - 0.4 x10E3/uL   Basophils Absolute 0.0 0.0 - 0.2 x10E3/uL   Immature Granulocytes 0 Not Estab. %   Immature Grans (Abs) 0.0 0.0 - 0.1 x10E3/uL  CMP14+EGFR  Result Value Ref Range   Glucose 86 65 - 99 mg/dL   BUN 11 6 - 20 mg/dL   Creatinine, Ser 0.86 0.57 - 1.00 mg/dL   eGFR 100 >59 mL/min/1.73   BUN/Creatinine Ratio 13 9 - 23   Sodium 142 134 - 144 mmol/L   Potassium 4.5 3.5 - 5.2 mmol/L   Chloride 103 96 - 106 mmol/L   CO2 20 20 - 29 mmol/L   Calcium 10.3 (H) 8.7 - 10.2 mg/dL   Total Protein 6.9 6.0 - 8.5 g/dL   Albumin 5.0 3.9 - 5.0 g/dL   Globulin, Total 1.9 1.5 - 4.5  g/dL   Albumin/Globulin Ratio 2.6 (H) 1.2 - 2.2   Bilirubin Total <0.2 0.0 - 1.2 mg/dL   Alkaline Phosphatase 105 42 - 106 IU/L   AST 13 0 - 40 IU/L   ALT 10 0 - 32 IU/L  Hepatitis C antibody  Result Value Ref Range   Hep C Virus Ab <0.1 0.0 - 0.9 s/co ratio  HIV Antibody (routine testing w rflx)  Result Value Ref Range   HIV Screen 4th Generation wRfx Non Reactive Non Reactive  Urine cytology ancillary only  Result Value Ref Range   Neisseria Gonorrhea Negative    Chlamydia Negative    Trichomonas Negative    Comment Normal Reference Range Trichomonas - Negative    Comment Normal Reference Ranger Chlamydia - Negative    Comment      Normal Reference Range Neisseria Gonorrhea - Negative    Natoya was seen today for dysuria.  Diagnoses and all orders for this visit:  Dysuria UA positive for 3+ blood, otherwise negative. She is currently on her menstrual cycle so this would account for the blood. Negative wet prep today. Urine cytology pending to rule out gonorrhea/chlamydia. Increase hydration, cranberry juice, AZO.  -     Urinalysis, Routine w reflex microscopic -     Urine cytology ancillary only -     WET PREP FOR TRICH, YEAST, CLUE -     Urine Culture  Vaginal discharge -     Urine cytology ancillary only -     WET PREP FOR TRICH, YEAST, CLUE  Return for CPE.  The patient indicates understanding of these issues and agrees with the plan.  Marjorie Smolder, FNP

## 2022-06-20 LAB — URINE CYTOLOGY ANCILLARY ONLY
Chlamydia: NEGATIVE
Comment: NEGATIVE
Comment: NORMAL
Neisseria Gonorrhea: NEGATIVE

## 2022-06-20 LAB — URINE CULTURE

## 2022-07-15 ENCOUNTER — Ambulatory Visit (INDEPENDENT_AMBULATORY_CARE_PROVIDER_SITE_OTHER): Payer: 59 | Admitting: Nurse Practitioner

## 2022-07-15 ENCOUNTER — Encounter: Payer: Self-pay | Admitting: Nurse Practitioner

## 2022-07-15 VITALS — BP 115/71 | HR 61 | Temp 98.4°F | Ht 65.0 in | Wt 145.0 lb

## 2022-07-15 DIAGNOSIS — R1031 Right lower quadrant pain: Secondary | ICD-10-CM

## 2022-07-15 MED ORDER — PREDNISONE 20 MG PO TABS: 20.0000 mg | ORAL_TABLET | Freq: Every day | ORAL | 0 refills | Status: AC

## 2022-07-15 NOTE — Progress Notes (Signed)
Acute Office Visit  Subjective:     Patient ID: Tracy Tanner, female    DOB: [birth year], 21 y.o.   MRN: [id number]  Chief Complaint  Patient presents with   Groin Pain    Still bothering her , on right side - only when she walks     Groin Pain The patient's primary symptoms include pelvic pain. The patient's pertinent negatives include no genital itching, genital lesions, genital odor, vaginal bleeding or vaginal discharge. This is a recurrent problem. Episode onset: more than 8 months ago. The problem occurs constantly. The problem has been gradually worsening. The pain is severe. The problem affects the right side. She is not pregnant. Associated symptoms include joint pain. Pertinent negatives include no abdominal pain, back pain, chills, dysuria, flank pain, headaches or rash. The symptoms are aggravated by activity. She has tried NSAIDs for the symptoms.    Review of Systems  Constitutional: Negative.  Negative for chills.  HENT: Negative.    Gastrointestinal:  Negative for abdominal pain.  Genitourinary:  Positive for pelvic pain. Negative for dysuria, flank pain and vaginal discharge.  Musculoskeletal:  Positive for joint pain. Negative for back pain.  Skin: Negative.  Negative for itching and rash.  Neurological:  Negative for headaches.  All other systems reviewed and are negative.       Objective:    BP 115/71   Pulse 61   Temp 98.4 F (36.9 C)   Ht 5\' 5"  (1.651 m)   Wt 145 lb (65.8 kg)   LMP 06/15/2022 (Exact Date) Comment: no bc  SpO2 100%   BMI 24.13 kg/m  BP Readings from Last 3 Encounters:  07/15/22 115/71  06/19/22 118/75  12/04/21 125/72   Wt Readings from Last 3 Encounters:  07/15/22 145 lb (65.8 kg)  06/19/22 146 lb (66.2 kg) (76 %, Z= 0.70)*  12/04/21 154 lb 6 oz (70 kg) (84 %, Z= 1.01)*   * Growth percentiles are based on CDC (Girls, 2-20 Years) data.      Physical Exam Vitals and nursing note reviewed.  Constitutional:       Appearance: Normal appearance.  HENT:     Head: Normocephalic.     Nose: Nose normal.  Cardiovascular:     Rate and Rhythm: Normal rate and regular rhythm.     Pulses: Normal pulses.     Heart sounds: Normal heart sounds.  Pulmonary:     Effort: Pulmonary effort is normal.     Breath sounds: Normal breath sounds.  Musculoskeletal:     Right upper leg: Tenderness present. No swelling, edema or deformity.       Legs:     Comments: Right groin pain  Skin:    Findings: No rash.  Neurological:     Mental Status: She is alert.     No results found for any visits on 07/15/22.      Assessment & Plan:  Unresolved right groin pain.  Symptoms are worse from 6 months ago.  Patient was unable to complete ultrasound due to being on campus for school.  Completed new ultrasound referral, continue Robaxin, continue ibuprofen for pain as needed.  Prednisone 20 mg tablet by mouth daily for 6 days.  Follow-up with 1 resolved symptoms. Problem List Items Addressed This Visit       Other   Groin pain, right - Primary   Relevant Medications   predniSONE (DELTASONE) 20 MG tablet   Other Relevant Orders   07/17/22 Pelvis  Complete    Meds ordered this encounter  Medications   predniSONE (DELTASONE) 20 MG tablet    Sig: Take 1 tablet (20 mg total) by mouth daily with breakfast.    Dispense:  6 tablet    Refill:  0    Order Specific Question:   Supervising Provider    Answer:   Mechele Claude [[id number]]    Return if symptoms worsen or fail to improve, for groin pain.  Daryll Drown, NP

## 2022-07-15 NOTE — Patient Instructions (Signed)
Adductor Muscle Strain  An adductor muscle strain, also called a groin strain or pull, is an injury to the muscles or tendons on the upper, inner part of the thigh. These muscles are called the adductor or groin muscles. They are responsible for moving the legs across the body or pulling the legs together. A muscle strain occurs when a muscle is overstretched and some muscle fibers are torn. The severity of an adductor muscle strain is rated as Grade 1, 2, or 3. A Grade 3 strain has the most tearing and pain. What are the causes? Adductor muscle strains usually occur during exercise or while participating in sports. This condition may be caused by: A sudden, violent force placed on the muscle, stretching it too far. Stretching the muscles too far or too suddenly, often during side-to-side motion with a sudden change in direction. Putting repeated stress on the adductor muscles over a long period of time. Performing vigorous activity without properly stretching or warming up the adductor muscles beforehand. Not being properly conditioned. What are the signs or symptoms? Symptoms of this condition include: Pain and tenderness in the groin area. This begins as sharp pain and persists as a dull ache. A popping or snapping feeling when the injury occurs (for severe strains). Swelling or bruising. Muscle spasms. Weakness in the leg. Stiffness in the groin area with decreased ability to move the affected muscles. How is this diagnosed? This condition may be diagnosed based on: A physical exam. Your medical history. How well you can do certain range of motion exercises. Imaging tests, such as MRI, ultrasound, or X-rays. Your strain may be rated based on how severe it is. The ratings are: Grade 1 strain (mild). Muscles are overstretched. There may be very small muscle tears. This type of strain generally heals in about one week. Grade 2 strain (moderate). Muscles are partially torn. This may take  one to two months to heal. Grade 3 strain (severe). Muscles are completely torn. A severe strain can take more than three months to heal. Grade 3 gluteal strains are rare. How is this treated? An adductor strain will often heal on its own. If needed, this condition may be treated with: PRICE therapy. PRICE stands for protection of the injured area, rest, ice, pressure (compression), and elevation. Medicines to help manage pain and swelling (anti-inflammatory medicines). Crutches. You may be directed to use these for the first few days to minimize your pain. Depending on the severity of the muscle strain, recovery time may vary from a few weeks to several months. Severe injuries often require 4-6 weeks for recovery. In those cases, complete healing can take 4-5 months. Follow these instructions at home: PRICE Therapy  Protect the muscle from being injured again. Rest. Do not use the strained muscle if it causes pain. If directed, put ice on the injured area: Put ice in a plastic bag. Place a towel between your skin and the bag. Leave the ice on for 20 minutes, 2-3 times a day. Do this for the first 2 days after the injury. Apply compression by wrapping the injured area with an elastic bandage as told by your health care provider. Raise (elevate) the injured area above the level of your heart while you are sitting or lying down. Activity Do not drive or use heavy machinery while taking prescription pain medicine. Walk, stretch, and do exercises as told by your health care provider. Only do these activities if you can do so without any pain. General instructions   Take over-the-counter and prescription medicines only as told by your health care provider. Follow your treatment plan as told by your health care provider. This may include: Physical therapy. Massage. Local electrical stimulation (transcutaneous electrical nerve stimulation, TENS). Keep all follow-up visits. This is important. How  is this prevented? Warm up and stretch before being active. Cool down and stretch after being active. Give your body time to rest between periods of activity. Make sure to use equipment that fits you. Be safe and responsible while being active to avoid slips and falls. Maintain physical fitness, including: Proper conditioning in the adductor muscles. Overall strength, flexibility, and endurance. Contact a health care provider if: You have increased pain or swelling in the affected area. Your symptoms are not improving or they are getting worse. Summary An adductor muscle strain, also called a groin strain or pull, is an injury to the muscles or tendons on the upper, inner part of the thigh. A muscle strain occurs when a muscle is overstretched and some muscle fibers are torn. Depending on the severity of the muscle strain, recovery time may vary from a few weeks to several months. This information is not intended to replace advice given to you by your health care provider. Make sure you discuss any questions you have with your health care provider. Document Revised: 04/18/2021 Document Reviewed: 04/18/2021 Elsevier Patient Education  2023 [employer]  

## 2022-07-19 ENCOUNTER — Telehealth: Payer: Self-pay | Admitting: Family Medicine

## 2022-07-23 ENCOUNTER — Other Ambulatory Visit: Payer: Self-pay | Admitting: Nurse Practitioner

## 2022-07-23 DIAGNOSIS — R1031 Right lower quadrant pain: Secondary | ICD-10-CM

## 2022-07-23 NOTE — Telephone Encounter (Signed)
I changed the order and new one is in, thanks

## 2022-08-01 ENCOUNTER — Ambulatory Visit (HOSPITAL_COMMUNITY): Payer: 59

## 2022-08-02 ENCOUNTER — Ambulatory Visit (HOSPITAL_COMMUNITY)
Admission: RE | Admit: 2022-08-02 | Discharge: 2022-08-02 | Disposition: A | Discharge status: Home / Self Care | Payer: 59 | Source: Ambulatory Visit | Attending: Nurse Practitioner | Admitting: Nurse Practitioner

## 2022-08-02 DIAGNOSIS — R1031 Right lower quadrant pain: Secondary | ICD-10-CM | POA: Diagnosis not present

## 2022-08-02 DIAGNOSIS — R1030 Lower abdominal pain, unspecified: Secondary | ICD-10-CM | POA: Diagnosis not present

## 2022-08-09 ENCOUNTER — Telehealth: Payer: Self-pay | Admitting: Family Medicine

## 2022-08-09 NOTE — Telephone Encounter (Signed)
lmtcb

## 2022-08-14 NOTE — Telephone Encounter (Signed)
Tracy Tanner, CMA  08/06/2022 10:17 AM EDT     Pt has been notified

## 2022-08-19 ENCOUNTER — Telehealth: Payer: Self-pay | Admitting: Family Medicine

## 2022-08-20 ENCOUNTER — Other Ambulatory Visit: Payer: Self-pay | Admitting: Nurse Practitioner

## 2022-08-20 DIAGNOSIS — R1031 Right lower quadrant pain: Secondary | ICD-10-CM

## 2022-08-20 NOTE — Telephone Encounter (Signed)
Ultrasound right groin placed

## 2022-08-21 NOTE — Telephone Encounter (Signed)
PT R/C Pt will be in class until 11:00

## 2022-08-21 NOTE — Telephone Encounter (Signed)
Lmtcb.

## 2022-08-22 ENCOUNTER — Other Ambulatory Visit: Payer: Self-pay | Admitting: Nurse Practitioner

## 2022-08-22 DIAGNOSIS — R1031 Right lower quadrant pain: Secondary | ICD-10-CM

## 2022-09-02 ENCOUNTER — Ambulatory Visit: Payer: 59 | Admitting: Physical Therapy

## 2022-09-30 ENCOUNTER — Ambulatory Visit: Payer: Self-pay | Admitting: Family Medicine

## 2022-10-01 DIAGNOSIS — Z124 Encounter for screening for malignant neoplasm of cervix: Secondary | ICD-10-CM | POA: Diagnosis not present

## 2022-10-01 DIAGNOSIS — Z30011 Encounter for initial prescription of contraceptive pills: Secondary | ICD-10-CM | POA: Diagnosis not present

## 2022-10-01 DIAGNOSIS — R69 Illness, unspecified: Secondary | ICD-10-CM | POA: Diagnosis not present

## 2022-10-01 DIAGNOSIS — Z3202 Encounter for pregnancy test, result negative: Secondary | ICD-10-CM | POA: Diagnosis not present

## 2022-10-09 ENCOUNTER — Ambulatory Visit: Payer: Self-pay | Admitting: Family Medicine

## 2023-02-04 ENCOUNTER — Emergency Department (HOSPITAL_BASED_OUTPATIENT_CLINIC_OR_DEPARTMENT_OTHER): Payer: Commercial Managed Care - HMO | Admitting: Radiology

## 2023-02-04 ENCOUNTER — Emergency Department (HOSPITAL_BASED_OUTPATIENT_CLINIC_OR_DEPARTMENT_OTHER)
Admission: EM | Admit: 2023-02-04 | Discharge: 2023-02-04 | Disposition: A | Discharge status: Home / Self Care | Payer: Commercial Managed Care - HMO | Attending: Emergency Medicine | Admitting: Emergency Medicine

## 2023-02-04 ENCOUNTER — Other Ambulatory Visit: Payer: Self-pay

## 2023-02-04 DIAGNOSIS — R0789 Other chest pain: Secondary | ICD-10-CM | POA: Diagnosis not present

## 2023-02-04 DIAGNOSIS — R079 Chest pain, unspecified: Secondary | ICD-10-CM | POA: Diagnosis present

## 2023-02-04 LAB — CBC
HCT: 41.6 % (ref 36.0–46.0)
Hemoglobin: 14.2 g/dL (ref 12.0–15.0)
MCH: 31.3 pg (ref 26.0–34.0)
MCHC: 34.1 g/dL (ref 30.0–36.0)
MCV: 91.6 fL (ref 80.0–100.0)
Platelets: 330 10*3/uL (ref 150–400)
RBC: 4.54 MIL/uL (ref 3.87–5.11)
RDW: 12.2 % (ref 11.5–15.5)
WBC: 7.8 10*3/uL (ref 4.0–10.5)
nRBC: 0 % (ref 0.0–0.2)

## 2023-02-04 LAB — TROPONIN I (HIGH SENSITIVITY)
Troponin I (High Sensitivity): <2 ng/L (ref <18)
Troponin I (High Sensitivity): <2 ng/L (ref <18)

## 2023-02-04 LAB — BASIC METABOLIC PANEL
Anion gap: 7 (ref 5–15)
BUN: 12 mg/dL (ref 6–20)
CO2: 28 mmol/L (ref 22–32)
Calcium: 9.9 mg/dL (ref 8.9–10.3)
Chloride: 106 mmol/L (ref 98–111)
Creatinine, Ser: 0.85 mg/dL (ref 0.44–1.00)
GFR, Estimated: >60 mL/min (ref >60)
Glucose, Bld: 81 mg/dL (ref 70–99)
Potassium: 4 mmol/L (ref 3.5–5.1)
Sodium: 141 mmol/L (ref 135–145)

## 2023-02-04 LAB — D-DIMER, QUANTITATIVE: D-Dimer, Quant: 0.28 ug/mL-FEU (ref 0.00–0.50)

## 2023-02-04 LAB — PREGNANCY, URINE: Preg Test, Ur: NEGATIVE

## 2023-02-04 NOTE — ED Notes (Signed)
Pt verbalized understanding of d/c instructions, meds, and followup care. Denies questions. VSS, no distress noted. Steady gait to exit with all belongings. 

## 2023-02-04 NOTE — ED Provider Notes (Signed)
[facility] Provider Note   CSN: [id number] Arrival date & time: 02/04/23  1655     History  Chief Complaint  Patient presents with   Chest Pain    Tracy Tanner is a 22 y.o. female.  Comes the ED for several days of chest pain.  She states it comes and goes and lasts several minutes at a time.  She states it is very sharp in nature and stabbing located below the right breast and to the back.  No abdominal pain nausea or vomiting with this.  No fevers or chills or cough.  No hemoptysis.  No lower extremity swelling or pain, she is not on any anticoagulants.  No family history of VTE, no recent travel surgeries, denies chance of pregnancy. She been taking antacids and NSAIDs at home without relief of the symptoms.  She states that she had about 5 episodes today so she called her PCP who advised coming to the ED to rule out a blood clot   Chest Pain      Home Medications Prior to Admission medications   Medication Sig Start Date End Date Taking? Authorizing Provider  predniSONE (DELTASONE) 20 MG tablet Take 1 tablet (20 mg total) by mouth daily with breakfast. 07/15/22   Ivy Lynn, NP  rizatriptan (MAXALT) 10 MG tablet Take 1 tablet (10 mg total) by mouth as needed for migraine. May repeat in 2 hours if needed 01/15/21   Gwenlyn Perking, FNP      Allergies    Patient has no known allergies.    Review of Systems   Review of Systems  Cardiovascular:  Positive for chest pain.    Physical Exam Updated Vital Signs BP (!) 141/88 (BP Location: Right Arm)   Pulse 87   Temp 98.1 F (36.7 C) (Oral)   Resp 18   LMP 01/23/2023 (Approximate)   SpO2 100%  Physical Exam Vitals and nursing note reviewed.  Constitutional:      General: She is not in acute distress.    Appearance: She is well-developed.  HENT:     Head: Normocephalic and atraumatic.  Eyes:     Conjunctiva/sclera: Conjunctivae normal.  Cardiovascular:     Rate  and Rhythm: Normal rate and regular rhythm.     Heart sounds: No murmur heard. Pulmonary:     Effort: Pulmonary effort is normal. No respiratory distress.     Breath sounds: Normal breath sounds.  Abdominal:     Palpations: Abdomen is soft.     Tenderness: There is no abdominal tenderness.  Musculoskeletal:        General: No swelling. Normal range of motion.     Cervical back: Neck supple.     Right lower leg: No tenderness. No edema.     Left lower leg: No tenderness. No edema.  Skin:    General: Skin is warm and dry.     Capillary Refill: Capillary refill takes less than 2 seconds.  Neurological:     Mental Status: She is alert.  Psychiatric:        Mood and Affect: Mood normal.     ED Results / Procedures / Treatments   Labs (all labs ordered are listed, but only abnormal results are displayed) Labs Reviewed  BASIC METABOLIC PANEL  CBC  PREGNANCY, URINE  D-DIMER, QUANTITATIVE  TROPONIN I (HIGH SENSITIVITY)  TROPONIN I (HIGH SENSITIVITY)    EKG EKG Interpretation  Date/Time:  Tuesday February 04 2023 17:18:24 EST Ventricular Rate:  73 PR Interval:  120 QRS Duration: 86 QT Interval:  426 QTC Calculation: 469 R Axis:   47 Text Interpretation: Normal sinus rhythm with sinus arrhythmia T wave abnormality, consider inferior ischemia Prolonged QT Abnormal ECG No previous ECGs available no prior ECG for comaprison. t wave inversionin lead 3. No STEMI Confirmed by Antony Blackbird [phone]) on 02/04/2023 6:02:44 PM  Radiology DG Chest 2 View  Result Date: 02/04/2023 CLINICAL DATA:  Chest pain EXAM: CHEST - 2 VIEW COMPARISON:  Chest x-ray January 14, 2014 FINDINGS: The heart size and mediastinal contours are within normal limits. Both lungs are clear. The visualized skeletal structures are unremarkable. IMPRESSION: No active cardiopulmonary disease. Electronically Signed   By: Dorise Bullion III M.D.   On: 02/04/2023 17:40    Procedures Procedures    Medications Ordered in  ED Medications - No data to display  ED Course/ Medical Decision Making/ A&P                             Medical Decision Making The patient presented today for chest pain that had started several days ago. EKG showed slightly long QT, no ischemia, Chest Xray was independently reviewed by me and shows no pulmonary edema or infiltrate, cardiac silhouette is within normal limits. I agree with radiology interpretation.   They were having symptoms while in the ED so no medications were given, had antacids and NSAIDs at home without any change in symptoms.  I considered a broad differential including but not limited to ACS, PE, Dissection, pneumothorax, costochondritis, pneumonia, GERD, pericarditis, and pericardial effusion.  PE is considered low risk and ruled out by D-dimer  I feel disssection is extremely unlikley  Symptoms and EKG are not consisted with pericardial effusion  Their heart score is low. Troponins show negative  Plan is for discharge home with follow-up with PCP.  Amount and/or Complexity of Data Reviewed Labs: ordered.           Final Clinical Impression(s) / ED Diagnoses Final diagnoses:  Other chest pain    Rx / DC Orders ED Discharge Orders     None         Darci Current 02/04/23 2004    Tegeler, Gwenyth Allegra, MD 02/04/23 2115

## 2023-02-04 NOTE — ED Triage Notes (Signed)
Pt sent here from [facility] physician for chest pain work up. Pt states she has had episodes of sharp pain underneath R breast that started Thursday lasting 3-5 min but today they are more frequent. They sent here here to get imaging of lungs, pt denies sob.

## 2023-02-04 NOTE — Discharge Instructions (Addendum)
Was a pleasure taking care of you today.  Regarding your chest pain and ruled out a blood clot.  Your EKG, other blood work including your cardiac enzymes and vital signs were all very reassuring.  Please follow close with your primary care doctor and come back to the ER for any new or worsening symptoms.

## 2023-02-05 ENCOUNTER — Telehealth: Payer: Self-pay

## 2023-02-05 NOTE — Transitions of Care (Post Inpatient/ED Visit) (Signed)
   02/05/2023  Name: Tracy Tanner MRN: [id number] DOB: [birth year]  Today's TOC FU Call Status: Today's TOC FU Call Status:: Unsuccessul Call (1st Attempt) Unsuccessful Call (1st Attempt) Date: 02/05/23  Attempted to reach the patient regarding the most recent Inpatient/ED visit.  Follow Up Plan: Additional outreach attempts will be made to reach the patient to complete the Transitions of Care (Post Inpatient/ED visit) call.   Signature Juanda Crumble, [place] Direct Dial [phone]

## 2023-02-06 NOTE — Transitions of Care (Post Inpatient/ED Visit) (Signed)
   02/06/2023  Name: Tracy Tanner MRN: [id number] DOB: March 15, 2001  Today's TOC FU Call Status: Today's TOC FU Call Status:: Unsuccessful Call (2nd Attempt) Unsuccessful Call (1st Attempt) Date: 02/05/23 Unsuccessful Call (2nd Attempt) Date: 02/06/23  Attempted to reach the patient regarding the most recent Inpatient/ED visit.  Follow Up Plan: Additional outreach attempts will be made to reach the patient to complete the Transitions of Care (Post Inpatient/ED visit) call.   Signature Juanda Crumble, [place] Direct Dial [phone]

## 2023-02-07 NOTE — Transitions of Care (Post Inpatient/ED Visit) (Signed)
   02/07/2023  Name: Tracy Tanner MRN: [id number] DOB: 2001-09-26  Today's TOC FU Call Status: Today's TOC FU Call Status:: Unsuccessful Call (3rd Attempt) Unsuccessful Call (1st Attempt) Date: 02/05/23 Unsuccessful Call (2nd Attempt) Date: 02/06/23 Unsuccessful Call (3rd Attempt) Date: 02/07/23  Attempted to reach the patient regarding the most recent Inpatient/ED visit.  Follow Up Plan: No further outreach attempts will be made at this time. We have been unable to contact the patient.  Signature Juanda Crumble, [place] Direct Dial [phone]

## 2023-06-03 ENCOUNTER — Emergency Department (HOSPITAL_BASED_OUTPATIENT_CLINIC_OR_DEPARTMENT_OTHER)
Admission: EM | Admit: 2023-06-03 | Discharge: 2023-06-03 | Disposition: A | Discharge status: Home / Self Care | Payer: Commercial Managed Care - HMO | Attending: Emergency Medicine | Admitting: Emergency Medicine

## 2023-06-03 ENCOUNTER — Other Ambulatory Visit: Payer: Self-pay

## 2023-06-03 ENCOUNTER — Encounter (HOSPITAL_BASED_OUTPATIENT_CLINIC_OR_DEPARTMENT_OTHER): Payer: Self-pay | Admitting: Emergency Medicine

## 2023-06-03 ENCOUNTER — Other Ambulatory Visit (HOSPITAL_BASED_OUTPATIENT_CLINIC_OR_DEPARTMENT_OTHER): Payer: Self-pay

## 2023-06-03 ENCOUNTER — Emergency Department (HOSPITAL_BASED_OUTPATIENT_CLINIC_OR_DEPARTMENT_OTHER): Payer: Commercial Managed Care - HMO

## 2023-06-03 DIAGNOSIS — N83202 Unspecified ovarian cyst, left side: Secondary | ICD-10-CM | POA: Insufficient documentation

## 2023-06-03 DIAGNOSIS — R1032 Left lower quadrant pain: Secondary | ICD-10-CM | POA: Diagnosis present

## 2023-06-03 DIAGNOSIS — R109 Unspecified abdominal pain: Secondary | ICD-10-CM

## 2023-06-03 LAB — URINALYSIS, ROUTINE W REFLEX MICROSCOPIC
Bacteria, UA: NONE SEEN
Bilirubin Urine: NEGATIVE
Glucose, UA: NEGATIVE mg/dL
Ketones, ur: NEGATIVE mg/dL
Leukocytes,Ua: NEGATIVE
Nitrite: NEGATIVE
Protein, ur: NEGATIVE mg/dL
Specific Gravity, Urine: 1.014 (ref 1.005–1.030)
pH: 6.5 (ref 5.0–8.0)

## 2023-06-03 LAB — COMPREHENSIVE METABOLIC PANEL
ALT: 10 U/L (ref 0–44)
AST: 15 U/L (ref 15–41)
Albumin: 5 g/dL (ref 3.5–5.0)
Alkaline Phosphatase: 67 U/L (ref 38–126)
Anion gap: 10 (ref 5–15)
BUN: 11 mg/dL (ref 6–20)
CO2: 25 mmol/L (ref 22–32)
Calcium: 10.1 mg/dL (ref 8.9–10.3)
Chloride: 107 mmol/L (ref 98–111)
Creatinine, Ser: 0.89 mg/dL (ref 0.44–1.00)
GFR, Estimated: >60 mL/min (ref >60)
Glucose, Bld: 85 mg/dL (ref 70–99)
Potassium: 4.6 mmol/L (ref 3.5–5.1)
Sodium: 142 mmol/L (ref 135–145)
Total Bilirubin: 0.5 mg/dL (ref 0.3–1.2)
Total Protein: 7.3 g/dL (ref 6.5–8.1)

## 2023-06-03 LAB — CBC WITH DIFFERENTIAL/PLATELET
Abs Immature Granulocytes: 0.05 10*3/uL (ref 0.00–0.07)
Basophils Absolute: 0 10*3/uL (ref 0.0–0.1)
Basophils Relative: 0 %
Eosinophils Absolute: 0.1 10*3/uL (ref 0.0–0.5)
Eosinophils Relative: 1 %
HCT: 44.5 % (ref 36.0–46.0)
Hemoglobin: 14.8 g/dL (ref 12.0–15.0)
Immature Granulocytes: 0 %
Lymphocytes Relative: 12 %
Lymphs Abs: 1.3 10*3/uL (ref 0.7–4.0)
MCH: 30.7 pg (ref 26.0–34.0)
MCHC: 33.3 g/dL (ref 30.0–36.0)
MCV: 92.3 fL (ref 80.0–100.0)
Monocytes Absolute: 0.8 10*3/uL (ref 0.1–1.0)
Monocytes Relative: 7 %
Neutro Abs: 9.3 10*3/uL — ABNORMAL HIGH (ref 1.7–7.7)
Neutrophils Relative %: 80 %
Platelets: 313 10*3/uL (ref 150–400)
RBC: 4.82 MIL/uL (ref 3.87–5.11)
RDW: 12.8 % (ref 11.5–15.5)
WBC: 11.6 10*3/uL — ABNORMAL HIGH (ref 4.0–10.5)
nRBC: 0 % (ref 0.0–0.2)

## 2023-06-03 LAB — LIPASE, BLOOD: Lipase: 12 U/L (ref 11–51)

## 2023-06-03 LAB — PREGNANCY, URINE: Preg Test, Ur: NEGATIVE

## 2023-06-03 MED ORDER — OXYCODONE-ACETAMINOPHEN 5-325 MG PO TABS: 1.0000 | ORAL_TABLET | Freq: Four times a day (QID) | ORAL | 0 refills | Status: AC | PRN

## 2023-06-03 MED ORDER — ONDANSETRON 4 MG PO TBDP: 4.0000 mg | ORAL_TABLET | Freq: Three times a day (TID) | ORAL | 0 refills | Status: AC | PRN

## 2023-06-03 NOTE — Discharge Instructions (Addendum)
You came to the ER for left flank pain. Your labs and imaging were concerning for a potential kidney stone, but there does not appear to be clear signs of any obstructing stone at this point. Since your symptoms have largely resolved, the stone may have already cleared through if there was a stone causing irritation. I have sent in a prescription for a pain medication and nausea medication to help at home if symptoms return. If you experience fevers, return to the ER. Otherwise, this should resolve on its own with time as you stay hydrated.

## 2023-06-03 NOTE — ED Triage Notes (Signed)
Pt arrives pov, steady gait with c/o LT flank pain radiating to lower back and LLQ upon waking today. Endorses n/v and urinary urgency, decreased output, denies fever

## 2023-06-03 NOTE — ED Provider Notes (Signed)
[place] EMERGENCY DEPARTMENT AT [facility] Provider Note   CSN: [id number] Arrival date & time: 06/03/23  0845     History Chief Complaint  Patient presents with   Flank Pain    Tracy Tanner is a 22 y.o. female.  Patient presents to the emergency department complaints of left flank pain with radiation to the left lower quadrant.  Reports this began today.  No prior history of kidney stones.  Denies any dysuria, hematuria, but does endorse some increasing urinary frequency and urgency and feels that she is straining to get urine out.  No recent fevers.  Denies that she could be pregnant at this time but patient is sexually active.  Denies any vaginal drainage or discharge or other concerns.   Flank Pain       Home Medications Prior to Admission medications   Medication Sig Start Date End Date Taking? Authorizing Provider  ondansetron (ZOFRAN-ODT) 4 MG disintegrating tablet Take 1 tablet (4 mg total) by mouth every 8 (eight) hours as needed for nausea or vomiting. 06/03/23  Yes Smitty Knudsen, PA-C  oxyCODONE-acetaminophen (PERCOCET/ROXICET) 5-325 MG tablet Take 1 tablet by mouth every 6 (six) hours as needed for severe pain. 06/03/23  Yes Smitty Knudsen, PA-C  predniSONE (DELTASONE) 20 MG tablet Take 1 tablet (20 mg total) by mouth daily with breakfast. 07/15/22   Daryll Drown, NP  rizatriptan (MAXALT) 10 MG tablet Take 1 tablet (10 mg total) by mouth as needed for migraine. May repeat in 2 hours if needed 01/15/21   Gabriel Earing, FNP      Allergies    Patient has no known allergies.    Review of Systems   Review of Systems  Genitourinary:  Positive for flank pain.  All other systems reviewed and are negative.   Physical Exam Updated Vital Signs BP (!) 146/86   Pulse 75   Temp 97.9 F (36.6 C) (Oral)   Resp 18   Ht 5\' 5"  (1.651 m)   Wt 66 kg   LMP 05/10/2023   SpO2 100%   BMI 24.21 kg/m  Physical Exam Vitals and nursing note reviewed.   Constitutional:      General: She is not in acute distress.    Appearance: She is well-developed.  HENT:     Head: Normocephalic and atraumatic.  Eyes:     Conjunctiva/sclera: Conjunctivae normal.  Cardiovascular:     Rate and Rhythm: Normal rate and regular rhythm.     Heart sounds: No murmur heard. Pulmonary:     Effort: Pulmonary effort is normal. No respiratory distress.     Breath sounds: Normal breath sounds.  Abdominal:     Palpations: Abdomen is soft.     Tenderness: There is no abdominal tenderness. There is left CVA tenderness. There is no right CVA tenderness.  Musculoskeletal:        General: No swelling.     Cervical back: Neck supple.  Skin:    General: Skin is warm and dry.     Capillary Refill: Capillary refill takes less than 2 seconds.  Neurological:     Mental Status: She is alert.  Psychiatric:        Mood and Affect: Mood normal.     ED Results / Procedures / Treatments   Labs (all labs ordered are listed, but only abnormal results are displayed) Labs Reviewed  CBC WITH DIFFERENTIAL/PLATELET - Abnormal; Notable for the following components:      Result  Value   WBC 11.6 (*)    Neutro Abs 9.3 (*)    All other components within normal limits  URINALYSIS, ROUTINE W REFLEX MICROSCOPIC - Abnormal; Notable for the following components:   Hgb urine dipstick MODERATE (*)    All other components within normal limits  COMPREHENSIVE METABOLIC PANEL  LIPASE, BLOOD  PREGNANCY, URINE    EKG None  Radiology CT Renal Stone Study  Result Date: 06/03/2023 CLINICAL DATA:  Flank pain. EXAM: CT ABDOMEN AND PELVIS WITHOUT CONTRAST TECHNIQUE: Multidetector CT imaging of the abdomen and pelvis was performed following the standard protocol without IV contrast. RADIATION DOSE REDUCTION: This exam was performed according to the departmental dose-optimization program which includes automated exposure control, adjustment of the mA and/or kV according to patient size and/or  use of iterative reconstruction technique. COMPARISON:  None Available. FINDINGS: Lower chest: Lung bases are clear.  No pleural effusion. Hepatobiliary: No focal liver abnormality is seen. No gallstones, gallbladder wall thickening, or biliary dilatation. Pancreas: Unremarkable. No pancreatic ductal dilatation or surrounding inflammatory changes. Spleen: Normal in size without focal abnormality. Adrenals/Urinary Tract: Adrenal glands are preserved. There several small bilateral nonobstructing renal stones. This includes a 3 mm focus in the left mid kidney and at least for 1-2 mm right-sided stones. No ureteral stones are identified. Preserved contours of the urinary bladder. Stomach/Bowel: Large bowel is of normal course and caliber with scattered colonic stool. Normal appendix. The stomach and small bowel are nondilated. No oral contrast. Vascular/Lymphatic: No significant vascular findings are present. No enlarged abdominal or pelvic lymph nodes. Reproductive: Uterus is present. There is a left adnexal cystic structure seen on series 2 image 66 measuring up to 3.8 cm. Likely ovarian. This appears simple by noncontrast CT. Other: No free air or free fluid. Presumed vascular calcifications in the low posterior pelvis. Musculoskeletal: No acute or significant osseous findings. IMPRESSION: Bilateral nonobstructing renal stones. No collecting system dilatation. Presumed vascular calcifications in the pelvis. Moderate colonic stool.  Normal appendix. 3.8 cm left ovarian cystic lesion. No follow-up imaging recommended. Note: This recommendation does not apply to premenarchal patients and to those with increased risk (genetic, family history, elevated tumor markers or other high-risk factors) of ovarian cancer. Reference: JACR 2020 Feb; 17(2):248-254 Electronically Signed   By: Karen Kays M.D.   On: 06/03/2023 11:15    Procedures Procedures   Medications Ordered in ED Medications - No data to display  ED  Course/ Medical Decision Making/ A&P                           Medical Decision Making Amount and/or Complexity of Data Reviewed Labs: ordered. Radiology: ordered.   This patient presents to the ED for concern of left flank pain.  Differential diagnosis includes nephrolithiasis, urolithiasis, pyelonephritis, ovarian cyst, diverticulitis   Lab Tests:  I Ordered, and personally interpreted labs.  The pertinent results include: CBC with mild leukocytosis at 11.6, CMP unremarkable, UA with evidence of blood but no other signs of infection, urine pregnancy negative, lipase normal   Imaging Studies ordered:  I ordered imaging studies including CT renal stone study I independently visualized and interpreted imaging which showed bilateral nonobstructing renal stones.  Suspected ovarian cyst of the left ovary. I agree with the radiologist interpretation   Problem List / ED Course:  Patient presents to the emergency department complaints of flank pain.  She reports that she noted this pain began early this morning as  it woke her out of her sleep and she endorses nausea and vomiting at that time.  States that the pain is largely improved since this morning but is still mildly present.  Does endorse some increased urinary frequency and urgency but denies any dysuria or obvious hematuria.  No prior history of nephrolithiasis.  Sexually active and potentially be pregnant but denies any chance of pregnancy.  Will assess with basic labs including urine pregnancy and also CT renal given some mild left CVA tenderness.  Will hold off on antiemetics or pain medication at this time as patient does not feel that she needs them. Lab work was largely unremarkable but there is some notable mild leukocytosis at 11.6 but no other overt signs of infection present at this time.  Urinalysis does show signs of blood but no leukocytes, nitrates, bacteria are seen.  CMP is normal.  Will further evaluate with CT scan for  potential nephrolithiasis. CT with evidence of bilateral nonobstructing renal stones but no signs of stones in the ureters or in the UPJ.  Moderate stool burden and a suspected simple left ovarian cyst were also noted on imaging.  Informed patient of these findings and believe the patient may have already passed the stone that was present.  Will provide patient with home analgesic medication as well as antiemetics to reduce the risk of returning to the emergency department but did discuss return precautions such as worsening pain, intractable vomiting, fever.  Patient is agreeable with treatment plan at this time and verbalized understanding all return precautions.  Patient discharged home in good condition is hemodynamically stable.  All questions answered prior to patient discharge.   Final Clinical Impression(s) / ED Diagnoses Final diagnoses:  Left flank pain  Left ovarian cyst    Rx / DC Orders ED Discharge Orders          Ordered    oxyCODONE-acetaminophen (PERCOCET/ROXICET) 5-325 MG tablet  Every 6 hours PRN        06/03/23 1156    ondansetron (ZOFRAN-ODT) 4 MG disintegrating tablet  Every 8 hours PRN        06/03/23 1156              Smitty Knudsen, PA-C 06/03/23 1159    Benjiman Core, MD 06/04/23 1314

## 2023-11-11 DIAGNOSIS — M25551 Pain in right hip: Secondary | ICD-10-CM | POA: Diagnosis not present

## 2023-11-18 DIAGNOSIS — J029 Acute pharyngitis, unspecified: Secondary | ICD-10-CM | POA: Diagnosis not present

## 2023-11-18 DIAGNOSIS — N390 Urinary tract infection, site not specified: Secondary | ICD-10-CM | POA: Diagnosis not present

## 2023-11-18 DIAGNOSIS — R5383 Other fatigue: Secondary | ICD-10-CM | POA: Diagnosis not present

## 2023-11-18 DIAGNOSIS — Z03818 Encounter for observation for suspected exposure to other biological agents ruled out: Secondary | ICD-10-CM | POA: Diagnosis not present

## 2024-02-27 DIAGNOSIS — S61431A Puncture wound without foreign body of right hand, initial encounter: Secondary | ICD-10-CM | POA: Diagnosis not present

## 2024-02-27 DIAGNOSIS — Y990 Civilian activity done for income or pay: Secondary | ICD-10-CM | POA: Diagnosis not present

## 2024-02-27 DIAGNOSIS — S61451A Open bite of right hand, initial encounter: Secondary | ICD-10-CM | POA: Diagnosis not present

## 2024-02-27 DIAGNOSIS — S51831A Puncture wound without foreign body of right forearm, initial encounter: Secondary | ICD-10-CM | POA: Diagnosis not present

## 2024-02-27 DIAGNOSIS — S51851A Open bite of right forearm, initial encounter: Secondary | ICD-10-CM | POA: Diagnosis not present

## 2024-02-27 DIAGNOSIS — Y9289 Other specified places as the place of occurrence of the external cause: Secondary | ICD-10-CM | POA: Diagnosis not present

## 2024-02-27 DIAGNOSIS — W5501XA Bitten by cat, initial encounter: Secondary | ICD-10-CM | POA: Diagnosis not present

## 2024-02-27 DIAGNOSIS — S61551A Open bite of right wrist, initial encounter: Secondary | ICD-10-CM | POA: Diagnosis not present

## 2024-02-27 DIAGNOSIS — S61031A Puncture wound without foreign body of right thumb without damage to nail, initial encounter: Secondary | ICD-10-CM | POA: Diagnosis not present

## 2024-03-16 DIAGNOSIS — Z124 Encounter for screening for malignant neoplasm of cervix: Secondary | ICD-10-CM | POA: Diagnosis not present

## 2024-03-16 DIAGNOSIS — K219 Gastro-esophageal reflux disease without esophagitis: Secondary | ICD-10-CM | POA: Diagnosis not present

## 2024-03-16 DIAGNOSIS — R5383 Other fatigue: Secondary | ICD-10-CM | POA: Diagnosis not present

## 2024-03-16 DIAGNOSIS — Z0000 Encounter for general adult medical examination without abnormal findings: Secondary | ICD-10-CM | POA: Diagnosis not present

## 2024-03-24 DIAGNOSIS — M25551 Pain in right hip: Secondary | ICD-10-CM | POA: Diagnosis not present

## 2024-04-05 DIAGNOSIS — M25551 Pain in right hip: Secondary | ICD-10-CM | POA: Diagnosis not present

## 2024-04-28 DIAGNOSIS — M25551 Pain in right hip: Secondary | ICD-10-CM | POA: Diagnosis not present

## 2024-04-28 DIAGNOSIS — R29898 Other symptoms and signs involving the musculoskeletal system: Secondary | ICD-10-CM | POA: Diagnosis not present

## 2024-04-28 DIAGNOSIS — M25651 Stiffness of right hip, not elsewhere classified: Secondary | ICD-10-CM | POA: Diagnosis not present

## 2024-05-20 DIAGNOSIS — R300 Dysuria: Secondary | ICD-10-CM | POA: Diagnosis not present

## 2024-05-20 DIAGNOSIS — R109 Unspecified abdominal pain: Secondary | ICD-10-CM | POA: Diagnosis not present

## 2024-05-28 DIAGNOSIS — M25551 Pain in right hip: Secondary | ICD-10-CM | POA: Diagnosis not present

## 2024-06-01 DIAGNOSIS — R269 Unspecified abnormalities of gait and mobility: Secondary | ICD-10-CM | POA: Diagnosis not present

## 2024-06-02 DIAGNOSIS — M94251 Chondromalacia, right hip: Secondary | ICD-10-CM | POA: Diagnosis not present

## 2024-06-02 DIAGNOSIS — M25851 Other specified joint disorders, right hip: Secondary | ICD-10-CM | POA: Diagnosis not present

## 2024-06-02 DIAGNOSIS — M25551 Pain in right hip: Secondary | ICD-10-CM | POA: Diagnosis not present

## 2024-06-02 DIAGNOSIS — M65851 Other synovitis and tenosynovitis, right thigh: Secondary | ICD-10-CM | POA: Diagnosis not present

## 2024-06-07 ENCOUNTER — Ambulatory Visit: Payer: Self-pay | Attending: Orthopedic Surgery

## 2024-06-07 DIAGNOSIS — R262 Difficulty in walking, not elsewhere classified: Secondary | ICD-10-CM | POA: Insufficient documentation

## 2024-06-07 DIAGNOSIS — M6281 Muscle weakness (generalized): Secondary | ICD-10-CM | POA: Insufficient documentation

## 2024-06-07 DIAGNOSIS — M25551 Pain in right hip: Secondary | ICD-10-CM | POA: Insufficient documentation

## 2024-06-07 NOTE — Therapy (Signed)
 OUTPATIENT PHYSICAL THERAPY LOWER EXTREMITY EVALUATION   Patient Name: Tracy Tanner MRN: [id number] DOB:June 14, 2001, 23 y.o., female Today's Date: 06/07/2024  END OF SESSION:  PT End of Session - 06/07/24 0809     Visit Number 1    Number of Visits 15    Date for PT Re-Evaluation 07/30/24    PT Start Time 0809    PT Stop Time 0848    PT Time Calculation (min) 39 min    Activity Tolerance Patient tolerated treatment well    Behavior During Therapy [facility] for tasks assessed/performed          History reviewed. No pertinent past medical history. History reviewed. No pertinent surgical history. Patient Active Problem List   Diagnosis Date Noted   Groin pain, right 12/04/2021   Migraine without aura and without status migrainosus, not intractable 03/26/2021   BMI 27.0-27.9,adult 03/26/2021   REFERRING PROVIDER: Kendell Aldon Missy MADISON, MD  REFERRING DIAG: Right hip pain  THERAPY DIAG:  Pain in right hip  Difficulty in walking, not elsewhere classified  Muscle weakness (generalized)  Rationale for Evaluation and Treatment: Rehabilitation  ONSET DATE: 06/02/24  SUBJECTIVE:   SUBJECTIVE STATEMENT: Patient reports that she had right hip surgery on 06/02/24. She notes that it has been going well since surgery as it has been better than expected. She has been using at least 8 hours per day if not more as it is the only thing that really helps her pain. She has some pain yesterday from her right hip down her leg into her ankle, but it has not done that before or since yesterday.   PERTINENT HISTORY: Unremarkable PAIN:  Are you having pain? Yes: NPRS scale: Current: 0/10 Worst: 7/10 Pain location: right hip  Pain description: sore Aggravating factors: none known Relieving factors: CPM machine, ice  PRECAUTIONS: Other: Hip  RED FLAGS: None   WEIGHT BEARING RESTRICTIONS: Yes no lifting over 10 pounds for 4 months and no more than 20% weight bearing on RLE   FALLS:   Has patient fallen in last 6 months? 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  PATIENT SURVEYS:  LEFS  Extreme difficulty/unable (0), Quite a bit of difficulty (1), Moderate difficulty (2), Little difficulty (3), No difficulty (4) Survey date:  06/07/24  Any of your usual work, housework or school activities 2  2. Usual hobbies, recreational or sporting activities 2  3. Getting into/out of the bath 1  4. Walking between rooms 2  5. Putting on socks/shoes 1  6. Squatting  1  7. Lifting an object, like a bag of groceries from the floor 1  8. Performing light activities around your home 2  9. Performing heavy activities around your home 0  10. Getting into/out of a car 3  11. Walking 2 blocks 3  12. Walking 1 mile 0  13. Going up/down 10 stairs (1 flight) 0  14. Standing for 1 hour 0  15.  sitting for 1 hour 3  16. Running on even ground 0  17. Running on uneven ground 0  18. Making sharp turns while running fast 0  19. Hopping  0  20. Rolling over in bed 0  Score total:  21/80     COGNITION: Overall cognitive status: Within functional limits for tasks assessed     SENSATION: Patient reports no numbness or tingling.   EDEMA:  No edema observed  PALPATION: TTP: along incision  LOWER EXTREMITY ROM: no restrictions or pain with right hip PROM  Active ROM Right eval Left eval  Hip flexion 90 120  Hip extension    Hip abduction    Hip adduction    Hip internal rotation    Hip external rotation    Knee flexion 120 140  Knee extension 0 0  Ankle dorsiflexion    Ankle plantarflexion    Ankle inversion    Ankle  eversion     (Blank rows = not tested)  LOWER EXTREMITY MMT: RLE not tested due to surgical incision  MMT Right eval Left eval  Hip flexion  4/5  Hip extension    Hip abduction    Hip adduction    Hip internal rotation    Hip external rotation    Knee flexion  4+/5  Knee extension  4+/5  Ankle dorsiflexion    Ankle plantarflexion    Ankle inversion    Ankle eversion     (Blank rows = not tested)  LOWER EXTREMITY SPECIAL TESTS:  Not tested due to surgical incision  GAIT: Assistive device utilized: Crutches Level of assistance: Modified independence Comments: PWB on RLE                                                                                                                                 TREATMENT DATE:     PATIENT EDUCATION:  Education details: Plan of care, prognosis, healing, and goals for physical therapy Person educated: Patient Education method: Explanation Education comprehension: verbalized understanding  HOME EXERCISE PROGRAM: [id number]  ASSESSMENT:  CLINICAL IMPRESSION: Patient is a 23 y.o. female who was seen today for physical therapy evaluation and treatment following a right hip arthroscopy on 06/02/24.  She presented with low pain severity and irritability with none of today's assessments reproducing her familiar symptoms.  Right hip active range of motion was limited due to her surgical condition.  Manual muscle testing to her right lower extremity was held at this time due to her surgical condition.  Recommend that she continue with skilled physical therapy to address her impairments to safely return to her prior level of function.  OBJECTIVE IMPAIRMENTS: Abnormal gait, decreased activity tolerance, decreased mobility, difficulty walking, decreased ROM, decreased strength, and pain.   ACTIVITY LIMITATIONS: lifting, standing, squatting, sleeping, stairs, and locomotion level  PARTICIPATION LIMITATIONS: community activity and  occupation  PERSONAL FACTORS: No additional personal factors are affecting patient's functional outcome.   REHAB POTENTIAL: Excellent  CLINICAL DECISION MAKING: Stable/uncomplicated  EVALUATION COMPLEXITY: Low   GOALS: Goals reviewed with patient? Yes  LONG TERM GOALS: Target date: 06/28/24  Patient will be independent with  her HEP. Baseline:  Goal status: INITIAL  2.  Patient will improve her LEFS score to at least 40/80 for improved perceived function with her daily activities. Baseline:  Goal status: INITIAL  3.  Patient will report being able to sleep without being awakened by her familiar with right hip symptoms. Baseline:  Goal status: INITIAL  4.  Patient will be able to complete her daily activities without her familiar symptoms exceeding 5/10. Baseline:  Goal status: INITIAL  Additional Long term goals will be updated as needed   PLAN:  PT FREQUENCY: 5x per week  PT DURATION: 3 weeks  PLANNED INTERVENTIONS: 97164- PT Re-evaluation, 97750- Physical Performance Testing, 97110-Therapeutic exercises, 97530- Therapeutic activity, [id number]- Neuromuscular re-education, 97535- Self Care, 02859- Manual therapy, [phone]- Gait training, [phone]- Electrical stimulation (unattended), 97016- Vasopneumatic device, Patient/Family education, Balance training, Stair training, Joint mobilization, Cryotherapy, and Moist heat  PLAN FOR NEXT SESSION: Pain-free isometrics and active assisted range of motion, passive range of motion, and modalities as needed, pending surgical protocol   Lacinda JAYSON Fass, PT 06/07/2024, 5:07 PM

## 2024-06-08 ENCOUNTER — Ambulatory Visit

## 2024-06-08 DIAGNOSIS — M6281 Muscle weakness (generalized): Secondary | ICD-10-CM

## 2024-06-08 DIAGNOSIS — M25551 Pain in right hip: Secondary | ICD-10-CM | POA: Diagnosis not present

## 2024-06-08 DIAGNOSIS — R262 Difficulty in walking, not elsewhere classified: Secondary | ICD-10-CM

## 2024-06-08 NOTE — Therapy (Signed)
 OUTPATIENT PHYSICAL THERAPY LOWER EXTREMITY TREATMENT   Patient Name: Tracy Tanner MRN: [id number] DOB:Jul 22, 2001, 23 y.o., female Today's Date: 06/08/2024  END OF SESSION:  PT End of Session - 06/08/24 0902     Visit Number 2    Number of Visits 15    Date for PT Re-Evaluation 07/30/24    PT Start Time 0845    PT Stop Time 0934    PT Time Calculation (min) 49 min    Activity Tolerance Patient tolerated treatment well    Behavior During Therapy [facility] for tasks assessed/performed          History reviewed. No pertinent past medical history. History reviewed. No pertinent surgical history. Patient Active Problem List   Diagnosis Date Noted   Groin pain, right 12/04/2021   Migraine without aura and without status migrainosus, not intractable 03/26/2021   BMI 27.0-27.9,adult 03/26/2021   REFERRING PROVIDER: Kendell Aldon Missy MADISON, MD  REFERRING DIAG: Right hip pain  THERAPY DIAG:  Pain in right hip  Difficulty in walking, not elsewhere classified  Muscle weakness (generalized)  Rationale for Evaluation and Treatment: Rehabilitation  ONSET DATE: 06/02/24  SUBJECTIVE:   SUBJECTIVE STATEMENT: Pt denies any pain today.  PERTINENT HISTORY: Unremarkable PAIN:  Are you having pain? No  PRECAUTIONS: Other: Hip  RED FLAGS: None   WEIGHT BEARING RESTRICTIONS: Yes no lifting over 10 pounds for 4 months and no more than 20% weight bearing on RLE   FALLS:  Has patient fallen in last 6 months? 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  PATIENT SURVEYS:  LEFS  Extreme difficulty/unable (0), Quite a bit of  difficulty (1), Moderate difficulty (2), Little difficulty (3), No difficulty (4) Survey date:  06/07/24  Any of your usual work, housework or school activities 2  2. Usual hobbies, recreational or sporting activities 2  3. Getting into/out of the bath 1  4. Walking between rooms 2  5. Putting on socks/shoes 1  6. Squatting  1  7. Lifting an object, like a bag of groceries from the floor 1  8. Performing light activities around your home 2  9. Performing heavy activities around your home 0  10. Getting into/out of a car 3  11. Walking 2 blocks 3  12. Walking 1 mile 0  13. Going up/down 10 stairs (1 flight) 0  14. Standing for 1 hour 0  15.  sitting for 1 hour 3  16. Running on even ground 0  17. Running on uneven ground 0  18. Making sharp turns while running fast 0  19. Hopping  0  20. Rolling over in bed 0  Score total:  21/80     COGNITION: Overall cognitive status: Within functional limits for tasks assessed     SENSATION: Patient reports no numbness or tingling.   EDEMA:  No edema observed  PALPATION: TTP: along incision  LOWER EXTREMITY ROM: no restrictions or pain with right hip PROM  Active ROM Right eval Left eval  Hip flexion 90 120  Hip extension    Hip abduction    Hip adduction    Hip internal rotation    Hip  external rotation    Knee flexion 120 140  Knee extension 0 0  Ankle dorsiflexion    Ankle plantarflexion    Ankle inversion    Ankle eversion     (Blank rows = not tested)  LOWER EXTREMITY MMT: RLE not tested due to surgical incision  MMT Right eval Left eval  Hip flexion  4/5  Hip extension    Hip abduction    Hip adduction    Hip internal rotation    Hip external rotation    Knee flexion  4+/5  Knee extension  4+/5  Ankle dorsiflexion    Ankle plantarflexion    Ankle inversion    Ankle eversion     (Blank rows = not tested)  LOWER EXTREMITY SPECIAL TESTS:  Not tested due to surgical incision  GAIT: Assistive device  utilized: Crutches Level of assistance: Modified independence Comments: PWB on RLE                                                                                                                                 TREATMENT DATE:     06/08/24                                  EXERCISE LOG  Exercise Repetitions and Resistance Comments  Recumbent Bike Lvl 1 x 15 mins   Ankle Pumps 2 mins   QS 20 reps x 3 sec hold   HS  20 reps x 3 sec hold   GS 20 reps x 3 sec hold   Passive IR @ 90 3 mins   Passive IR roll 3 mins    Blank cell = exercise not performed today   PATIENT EDUCATION:  Education details: Plan of care, prognosis, healing, and goals for physical therapy Person educated: Patient Education method: Explanation Education comprehension: verbalized understanding  HOME EXERCISE PROGRAM: [id number]  ASSESSMENT:  CLINICAL IMPRESSION: Patient arrives for today's treatment session denying any pain.  Pt able to email protocol to office so we could print it out and have it on file.  Pt introduced to recumbent bike today with resistance level one according to protocol.  Pt also introduced to light exercises as listed above.  Pt given cues for proper technique with all newly added exercises.  Pt taken to scale to demonstrate what 20 lbs of weight feels like on RLE.  Pt's sister instructed in perform all passive ROM exercises so that she can assist pt at home.  Pt able to demonstrate understanding.  Pt denies any pain at completion of today's treatment session.    OBJECTIVE IMPAIRMENTS: Abnormal gait, decreased activity tolerance, decreased mobility, difficulty walking, decreased ROM, decreased strength, and pain.   ACTIVITY LIMITATIONS: lifting, standing, squatting, sleeping, stairs, and locomotion level  PARTICIPATION LIMITATIONS: community activity and occupation  PERSONAL FACTORS: No additional personal factors are affecting patient's functional  outcome.   REHAB POTENTIAL:  Excellent  CLINICAL DECISION MAKING: Stable/uncomplicated  EVALUATION COMPLEXITY: Low   GOALS: Goals reviewed with patient? Yes  LONG TERM GOALS: Target date: 06/28/24  Patient will be independent with her HEP. Baseline:  Goal status: INITIAL  2.  Patient will improve her LEFS score to at least 40/80 for improved perceived function with her daily activities. Baseline:  Goal status: INITIAL  3.  Patient will report being able to sleep without being awakened by her familiar with right hip symptoms. Baseline:  Goal status: INITIAL  4.  Patient will be able to complete her daily activities without her familiar symptoms exceeding 5/10. Baseline:  Goal status: INITIAL  Additional Long term goals will be updated as needed   PLAN:  PT FREQUENCY: 5x per week  PT DURATION: 3 weeks  PLANNED INTERVENTIONS: 97164- PT Re-evaluation, 97750- Physical Performance Testing, 97110-Therapeutic exercises, 97530- Therapeutic activity, 97112- Neuromuscular re-education, 97535- Self Care, 02859- Manual therapy, [phone]- Gait training, [phone]- Electrical stimulation (unattended), 97016- Vasopneumatic device, Patient/Family education, Balance training, Stair training, Joint mobilization, Cryotherapy, and Moist heat  PLAN FOR NEXT SESSION: Pain-free isometrics and active assisted range of motion, passive range of motion, and modalities as needed, pending surgical protocol   Delon DELENA Gosling, PTA 06/08/2024, 10:26 AM

## 2024-06-09 ENCOUNTER — Encounter

## 2024-06-10 ENCOUNTER — Encounter

## 2024-06-11 ENCOUNTER — Encounter

## 2024-06-14 ENCOUNTER — Ambulatory Visit

## 2024-06-14 DIAGNOSIS — R262 Difficulty in walking, not elsewhere classified: Secondary | ICD-10-CM | POA: Diagnosis not present

## 2024-06-14 DIAGNOSIS — M25551 Pain in right hip: Secondary | ICD-10-CM

## 2024-06-14 DIAGNOSIS — M6281 Muscle weakness (generalized): Secondary | ICD-10-CM

## 2024-06-14 NOTE — Therapy (Signed)
 OUTPATIENT PHYSICAL THERAPY LOWER EXTREMITY TREATMENT   Patient Name: Tracy Tanner MRN: [id number] DOB:10-Sep-2001, 23 y.o., female Today's Date: 06/14/2024  END OF SESSION:  PT End of Session - 06/14/24 0926     Visit Number 3    Number of Visits 15    Date for PT Re-Evaluation 07/30/24    PT Start Time 0930    PT Stop Time 1011    PT Time Calculation (min) 41 min    Activity Tolerance Patient tolerated treatment well    Behavior During Therapy [facility] for tasks assessed/performed           History reviewed. No pertinent past medical history. History reviewed. No pertinent surgical history. Patient Active Problem List   Diagnosis Date Noted   Groin pain, right 12/04/2021   Migraine without aura and without status migrainosus, not intractable 03/26/2021   BMI 27.0-27.9,adult 03/26/2021   REFERRING PROVIDER: Kendell Aldon Missy MADISON, MD  REFERRING DIAG: Right hip pain  THERAPY DIAG:  Pain in right hip  Difficulty in walking, not elsewhere classified  Muscle weakness (generalized)  Rationale for Evaluation and Treatment: Rehabilitation  ONSET DATE: 06/02/24  SUBJECTIVE:   SUBJECTIVE STATEMENT: Patient reports that she is not hurting and she has not had any problems since her last appointment.   PERTINENT HISTORY: Unremarkable PAIN:  Are you having pain? No  PRECAUTIONS: Other: Hip  RED FLAGS: None   WEIGHT BEARING RESTRICTIONS: Yes no lifting over 10 pounds for 4 months and no more than 20% weight bearing on RLE   FALLS:  Has patient fallen in last 6 months? 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  PATIENT SURVEYS:  LEFS  Extreme difficulty/unable (0), Quite a bit of difficulty (1), Moderate difficulty (2), Little difficulty (3), No difficulty (4) Survey date:  06/07/24  Any of your usual work, housework or school activities 2  2. Usual hobbies, recreational or sporting activities 2  3. Getting into/out of the bath 1  4. Walking between rooms 2  5. Putting on socks/shoes 1  6. Squatting  1  7. Lifting an object, like a bag of groceries from the floor 1  8. Performing light activities around your home 2  9. Performing heavy activities around your home 0  10. Getting into/out of a car 3  11. Walking 2 blocks 3  12. Walking 1 mile 0  13. Going up/down 10 stairs (1 flight) 0  14. Standing for 1 hour 0  15.  sitting for 1 hour 3  16. Running on even ground 0  17. Running on uneven ground 0  18. Making sharp turns while running fast 0  19. Hopping  0  20. Rolling over in bed 0  Score total:  21/80     COGNITION: Overall cognitive status: Within functional limits for tasks assessed     SENSATION: Patient reports no numbness or tingling.   EDEMA:  No edema observed  PALPATION: TTP: along incision  LOWER EXTREMITY ROM: no restrictions or pain with right hip PROM  Active ROM Right eval Left eval  Hip flexion 90 120  Hip extension    Hip abduction  Hip adduction    Hip internal rotation    Hip external rotation    Knee flexion 120 140  Knee extension 0 0  Ankle dorsiflexion    Ankle plantarflexion    Ankle inversion    Ankle eversion     (Blank rows = not tested)  LOWER EXTREMITY MMT: RLE not tested due to surgical incision  MMT Right eval Left eval  Hip flexion  4/5  Hip extension    Hip abduction    Hip adduction    Hip internal rotation    Hip external rotation    Knee flexion  4+/5  Knee extension  4+/5  Ankle dorsiflexion    Ankle plantarflexion    Ankle inversion    Ankle eversion     (Blank rows = not tested)  LOWER EXTREMITY  SPECIAL TESTS:  Not tested due to surgical incision  GAIT: Assistive device utilized: Crutches Level of assistance: Modified independence Comments: PWB on RLE                                                                                                                                 TREATMENT DATE:                                     06/14/24 EXERCISE LOG  Exercise Repetitions and Resistance Comments  Recumbent bike  L1 x 15 minutes    Manual therapy  See below   Quad set  20 reps w/ 5 second hold   Supine hamstring set  20 reps w/ 5 second hold   Supine glute sets  20 reps w/ 5 second hold   Prone piriformis stretch  3 minutes    Blank cell = exercise not performed today  Manual Therapy Passive ROM: right hip, all planes within surgical protocol   06/08/24                                  EXERCISE LOG  Exercise Repetitions and Resistance Comments  Recumbent Bike Lvl 1 x 15 mins   Ankle Pumps 2 mins   QS 20 reps x 3 sec hold   HS  20 reps x 3 sec hold   GS 20 reps x 3 sec hold   Passive IR @ 90 3 mins   Passive IR roll 3 mins    Blank cell = exercise not performed today   PATIENT EDUCATION:  Education details: HEP, healing, and surgical protocol  Person educated: Patient Education method: Explanation Education comprehension: verbalized understanding  HOME EXERCISE PROGRAM: [id number] on 06/14/24  ASSESSMENT:  CLINICAL IMPRESSION: Patient was introduced to a prone piriformis stretch to facilitate improved right hip mobility. She noted that she had not completed the isometric interventions in her HEP since her last  appointment. These interventions were reviewed and she required minimal cueing with these interventions for proper exercise performance. Manual therapy focused on improved right hip passive range of motion within her surgical protocol. She experienced no pain or discomfort with any of today's interventions. She reported feeling good upon the conclusion  of treatment. She continues to require skilled physical therapy to address her remaining impairments to return to her prior level of function.   OBJECTIVE IMPAIRMENTS: Abnormal gait, decreased activity tolerance, decreased mobility, difficulty walking, decreased ROM, decreased strength, and pain.   ACTIVITY LIMITATIONS: lifting, standing, squatting, sleeping, stairs, and locomotion level  PARTICIPATION LIMITATIONS: community activity and occupation  PERSONAL FACTORS: No additional personal factors are affecting patient's functional outcome.   REHAB POTENTIAL: Excellent  CLINICAL DECISION MAKING: Stable/uncomplicated  EVALUATION COMPLEXITY: Low   GOALS: Goals reviewed with patient? Yes  LONG TERM GOALS: Target date: 06/28/24  Patient will be independent with her HEP. Baseline:  Goal status: INITIAL  2.  Patient will improve her LEFS score to at least 40/80 for improved perceived function with her daily activities. Baseline:  Goal status: INITIAL  3.  Patient will report being able to sleep without being awakened by her familiar with right hip symptoms. Baseline:  Goal status: INITIAL  4.  Patient will be able to complete her daily activities without her familiar symptoms exceeding 5/10. Baseline:  Goal status: INITIAL  Additional Long term goals will be updated as needed   PLAN:  PT FREQUENCY: 5x per week  PT DURATION: 3 weeks  PLANNED INTERVENTIONS: 97164- PT Re-evaluation, 97750- Physical Performance Testing, 97110-Therapeutic exercises, 97530- Therapeutic activity, [id number]- Neuromuscular re-education, 97535- Self Care, 02859- Manual therapy, [phone]- Gait training, [phone]- Electrical stimulation (unattended), 97016- Vasopneumatic device, Patient/Family education, Balance training, Stair training, Joint mobilization, Cryotherapy, and Moist heat  PLAN FOR NEXT SESSION: Pain-free isometrics and active assisted range of motion, passive range of motion, and modalities as  needed, pending surgical protocol   Lacinda JAYSON Fass, PT 06/14/2024, 10:21 AM

## 2024-06-17 ENCOUNTER — Ambulatory Visit

## 2024-06-17 DIAGNOSIS — R262 Difficulty in walking, not elsewhere classified: Secondary | ICD-10-CM

## 2024-06-17 DIAGNOSIS — M25551 Pain in right hip: Secondary | ICD-10-CM | POA: Diagnosis not present

## 2024-06-17 DIAGNOSIS — M6281 Muscle weakness (generalized): Secondary | ICD-10-CM | POA: Diagnosis not present

## 2024-06-17 NOTE — Therapy (Signed)
 OUTPATIENT PHYSICAL THERAPY LOWER EXTREMITY TREATMENT   Patient Name: Tracy Tanner MRN: [id number] DOB:Jul 27, 2001, 23 y.o., female Today's Date: 06/17/2024  END OF SESSION:  PT End of Session - 06/17/24 0933     Visit Number 4    Number of Visits 15    Date for PT Re-Evaluation 07/30/24    PT Start Time 0930    PT Stop Time 1008    PT Time Calculation (min) 38 min    Activity Tolerance Patient tolerated treatment well    Behavior During Therapy [facility] for tasks assessed/performed            History reviewed. No pertinent past medical history. History reviewed. No pertinent surgical history. Patient Active Problem List   Diagnosis Date Noted   Groin pain, right 12/04/2021   Migraine without aura and without status migrainosus, not intractable 03/26/2021   BMI 27.0-27.9,adult 03/26/2021   REFERRING PROVIDER: Kendell Aldon Missy MADISON, MD  REFERRING DIAG: Right hip pain  THERAPY DIAG:  Pain in right hip  Difficulty in walking, not elsewhere classified  Muscle weakness (generalized)  Rationale for Evaluation and Treatment: Rehabilitation  ONSET DATE: 06/02/24  SUBJECTIVE:   SUBJECTIVE STATEMENT: Patient reports that she has been doing her HEP once per day. She is not hurting any.   PERTINENT HISTORY: Unremarkable PAIN:  Are you having pain? No  PRECAUTIONS: Other: Hip  RED FLAGS: None   WEIGHT BEARING RESTRICTIONS: Yes no lifting over 10 pounds for 4 months and no more than 20% weight bearing on RLE   FALLS:  Has patient fallen in last 6 months? 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  PATIENT  SURVEYS:  LEFS  Extreme difficulty/unable (0), Quite a bit of difficulty (1), Moderate difficulty (2), Little difficulty (3), No difficulty (4) Survey date:  06/07/24  Any of your usual work, housework or school activities 2  2. Usual hobbies, recreational or sporting activities 2  3. Getting into/out of the bath 1  4. Walking between rooms 2  5. Putting on socks/shoes 1  6. Squatting  1  7. Lifting an object, like a bag of groceries from the floor 1  8. Performing light activities around your home 2  9. Performing heavy activities around your home 0  10. Getting into/out of a car 3  11. Walking 2 blocks 3  12. Walking 1 mile 0  13. Going up/down 10 stairs (1 flight) 0  14. Standing for 1 hour 0  15.  sitting for 1 hour 3  16. Running on even ground 0  17. Running on uneven ground 0  18. Making sharp turns while running fast 0  19. Hopping  0  20. Rolling over in bed 0  Score total:  21/80     COGNITION: Overall cognitive status: Within functional limits for tasks assessed     SENSATION: Patient reports no numbness or tingling.   EDEMA:  No edema observed  PALPATION: TTP: along incision  LOWER EXTREMITY ROM: no restrictions or pain with right hip PROM  Active ROM Right eval Left eval  Hip flexion 90 120  Hip extension    Hip abduction  Hip adduction    Hip internal rotation    Hip external rotation    Knee flexion 120 140  Knee extension 0 0  Ankle dorsiflexion    Ankle plantarflexion    Ankle inversion    Ankle eversion     (Blank rows = not tested)  LOWER EXTREMITY MMT: RLE not tested due to surgical incision  MMT Right eval Left eval  Hip flexion  4/5  Hip extension    Hip abduction    Hip adduction    Hip internal rotation    Hip external rotation    Knee flexion  4+/5  Knee extension  4+/5  Ankle dorsiflexion    Ankle plantarflexion    Ankle inversion    Ankle eversion     (Blank rows = not tested)  LOWER EXTREMITY SPECIAL TESTS:  Not  tested due to surgical incision  GAIT: Assistive device utilized: Crutches Level of assistance: Modified independence Comments: PWB on RLE                                                                                                                                 TREATMENT DATE:                                     06/17/24 EXERCISE LOG  Exercise Repetitions and Resistance Comments  Recumbent bike  L1 x 15 minutes    Supine heel slide 30 reps    Supine hip ADD isometric 3 minutes w/ 5 second hold   Supine hip ABD isometric  2.5 minutes w/ 5 second hold  With therapist assistance  Resisted hip IR/ER  Red t-band x 30 reps each         Blank cell = exercise not performed today                                    06/14/24 EXERCISE LOG  Exercise Repetitions and Resistance Comments  Recumbent bike  L1 x 15 minutes    Manual therapy  See below   Quad set  20 reps w/ 5 second hold   Supine hamstring set  20 reps w/ 5 second hold   Supine glute sets  20 reps w/ 5 second hold   Prone piriformis stretch  3 minutes    Blank cell = exercise not performed today  Manual Therapy Passive ROM: right hip, all planes within surgical protocol   PATIENT EDUCATION:  Education details: HEP, healing, and surgical protocol  Person educated: Patient Education method: Explanation Education comprehension: verbalized understanding  HOME EXERCISE PROGRAM: [id number] on 06/14/24  ASSESSMENT:  CLINICAL IMPRESSION: Patient was introduced to new isometric and resisted interventions for improved hip engagement. She required minimal cueing with today's new interventions to facilitate appropriate muscular  engagement. She experienced a mild increase in fatigue with today's resisted internal and external rotation. However, she experienced no increase in pain or discomfort with any of today's interventions. She reported feeling good upon the conclusion of treatment. She continues to require skilled  physical therapy to address her remaining impairments to return to her prior level of function.    OBJECTIVE IMPAIRMENTS: Abnormal gait, decreased activity tolerance, decreased mobility, difficulty walking, decreased ROM, decreased strength, and pain.   ACTIVITY LIMITATIONS: lifting, standing, squatting, sleeping, stairs, and locomotion level  PARTICIPATION LIMITATIONS: community activity and occupation  PERSONAL FACTORS: No additional personal factors are affecting patient's functional outcome.   REHAB POTENTIAL: Excellent  CLINICAL DECISION MAKING: Stable/uncomplicated  EVALUATION COMPLEXITY: Low   GOALS: Goals reviewed with patient? Yes  LONG TERM GOALS: Target date: 06/28/24  Patient will be independent with her HEP. Baseline:  Goal status: INITIAL  2.  Patient will improve her LEFS score to at least 40/80 for improved perceived function with her daily activities. Baseline:  Goal status: INITIAL  3.  Patient will report being able to sleep without being awakened by her familiar with right hip symptoms. Baseline:  Goal status: INITIAL  4.  Patient will be able to complete her daily activities without her familiar symptoms exceeding 5/10. Baseline:  Goal status: INITIAL  Additional Long term goals will be updated as needed   PLAN:  PT FREQUENCY: 5x per week  PT DURATION: 3 weeks  PLANNED INTERVENTIONS: 97164- PT Re-evaluation, 97750- Physical Performance Testing, 97110-Therapeutic exercises, 97530- Therapeutic activity, 97112- Neuromuscular re-education, 97535- Self Care, 02859- Manual therapy, [phone]- Gait training, [phone]- Electrical stimulation (unattended), 97016- Vasopneumatic device, Patient/Family education, Balance training, Stair training, Joint mobilization, Cryotherapy, and Moist heat  PLAN FOR NEXT SESSION: Pain-free isometrics and active assisted range of motion, passive range of motion, and modalities as needed, pending surgical protocol   Lacinda JAYSON Fass, PT 06/17/2024, 12:56 PM

## 2024-06-24 ENCOUNTER — Encounter

## 2024-06-25 ENCOUNTER — Ambulatory Visit

## 2024-06-25 DIAGNOSIS — R262 Difficulty in walking, not elsewhere classified: Secondary | ICD-10-CM

## 2024-06-25 DIAGNOSIS — M6281 Muscle weakness (generalized): Secondary | ICD-10-CM

## 2024-06-25 DIAGNOSIS — M25551 Pain in right hip: Secondary | ICD-10-CM | POA: Diagnosis not present

## 2024-06-25 NOTE — Therapy (Signed)
 OUTPATIENT PHYSICAL THERAPY LOWER EXTREMITY TREATMENT   Patient Name: Tracy Tanner MRN: [id number] DOB:Jun 20, 2001, 23 y.o., female Today's Date: 06/25/2024  END OF SESSION:  PT End of Session - 06/25/24 0759     Visit Number 5    Number of Visits 15    Date for PT Re-Evaluation 07/30/24    PT Start Time 0800    PT Stop Time 0846    PT Time Calculation (min) 46 min    Activity Tolerance Patient tolerated treatment well    Behavior During Therapy [facility] for tasks assessed/performed            History reviewed. No pertinent past medical history. History reviewed. No pertinent surgical history. Patient Active Problem List   Diagnosis Date Noted   Groin pain, right 12/04/2021   Migraine without aura and without status migrainosus, not intractable 03/26/2021   BMI 27.0-27.9,adult 03/26/2021   REFERRING PROVIDER: Kendell Aldon Missy MADISON, MD  REFERRING DIAG: Right hip pain  THERAPY DIAG:  Pain in right hip  Difficulty in walking, not elsewhere classified  Muscle weakness (generalized)  Rationale for Evaluation and Treatment: Rehabilitation  ONSET DATE: 06/02/24  SUBJECTIVE:   SUBJECTIVE STATEMENT: Patient reports that they are doing well and haven't been having any pain.  PERTINENT HISTORY: Unremarkable PAIN:  Are you having pain? No  PRECAUTIONS: Other: Hip  RED FLAGS: None   WEIGHT BEARING RESTRICTIONS: Yes no lifting over 10 pounds for 4 months and no more than 20% weight bearing on RLE   FALLS:  Has patient fallen in last 6 months? 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  PATIENT SURVEYS:  LEFS   Extreme difficulty/unable (0), Quite a bit of difficulty (1), Moderate difficulty (2), Little difficulty (3), No difficulty (4) Survey date:  06/07/24  Any of your usual work, housework or school activities 2  2. Usual hobbies, recreational or sporting activities 2  3. Getting into/out of the bath 1  4. Walking between rooms 2  5. Putting on socks/shoes 1  6. Squatting  1  7. Lifting an object, like a bag of groceries from the floor 1  8. Performing light activities around your home 2  9. Performing heavy activities around your home 0  10. Getting into/out of a car 3  11. Walking 2 blocks 3  12. Walking 1 mile 0  13. Going up/down 10 stairs (1 flight) 0  14. Standing for 1 hour 0  15.  sitting for 1 hour 3  16. Running on even ground 0  17. Running on uneven ground 0  18. Making sharp turns while running fast 0  19. Hopping  0  20. Rolling over in bed 0  Score total:  21/80     COGNITION: Overall cognitive status: Within functional limits for tasks assessed     SENSATION: Patient reports no numbness or tingling.   EDEMA:  No edema observed  PALPATION: TTP: along incision  LOWER EXTREMITY ROM: no restrictions or pain with right hip PROM  Active ROM Right eval Left eval  Hip flexion 90 120  Hip extension    Hip abduction    Hip adduction  Hip internal rotation    Hip external rotation    Knee flexion 120 140  Knee extension 0 0  Ankle dorsiflexion    Ankle plantarflexion    Ankle inversion    Ankle eversion     (Blank rows = not tested)  LOWER EXTREMITY MMT: RLE not tested due to surgical incision  MMT Right eval Left eval  Hip flexion  4/5  Hip extension    Hip abduction    Hip adduction    Hip internal rotation    Hip external rotation    Knee flexion  4+/5  Knee extension  4+/5  Ankle dorsiflexion    Ankle plantarflexion    Ankle inversion    Ankle eversion     (Blank rows = not tested)  LOWER EXTREMITY SPECIAL TESTS:  Not tested due to  surgical incision  GAIT: Assistive device utilized: Crutches Level of assistance: Modified independence Comments: PWB on RLE                                                                                                                                 TREATMENT DATE:   06/25/24                                    EXERCISE LOG  Exercise Repetitions and Resistance Comments  Recumbent Bike L1x15 minutes   Supine heel slide 2x60s   Sidelying hip ADD 2x60s   Clamshells 3x60s   SLR 2x10   Prone IR/ER 2x10 each Therapist resistance to tolerance  Bird dogs sitting 2x30s     Blank cell = exercise not performed today                                    06/17/24 EXERCISE LOG  Exercise Repetitions and Resistance Comments  Recumbent bike  L1 x 15 minutes    Supine heel slide 30 reps    Supine hip ADD isometric 3 minutes w/ 5 second hold   Supine hip ABD isometric  2.5 minutes w/ 5 second hold  With therapist assistance  Resisted hip IR/ER  Red t-band x 30 reps each         Blank cell = exercise not performed today                                    06/14/24 EXERCISE LOG  Exercise Repetitions and Resistance Comments  Recumbent bike  L1 x 15 minutes    Manual therapy  See below   Quad set  20 reps w/ 5 second hold   Supine hamstring set  20 reps w/ 5 second hold   Supine glute sets  20 reps w/ 5 second hold  Prone piriformis stretch  3 minutes    Blank cell = exercise not performed today  Manual Therapy Passive ROM: right hip, all planes within surgical protocol   PATIENT EDUCATION:  Education details: HEP, precuations, and surgical protocol  Person educated: Patient Education method: Explanation Education comprehension: verbalized understanding  HOME EXERCISE PROGRAM: [id number] on 06/14/24  ASSESSMENT:  CLINICAL IMPRESSION:  Patient reports for visit 5 progressing through her protocol very well. Progressed patient today with gentle strengthening for hip flexors  and glute med. Verbal cueing required to maintain proper body mechanics throughout. Patient educated on protocol and maintaining precautions through the next few visits. Patient reports compliance with HEP without pain. She continues to require skilled physical therapy to address her remaining impairments to return to her prior level of function.    OBJECTIVE IMPAIRMENTS: Abnormal gait, decreased activity tolerance, decreased mobility, difficulty walking, decreased ROM, decreased strength, and pain.   ACTIVITY LIMITATIONS: lifting, standing, squatting, sleeping, stairs, and locomotion level  PARTICIPATION LIMITATIONS: community activity and occupation  PERSONAL FACTORS: No additional personal factors are affecting patient's functional outcome.   REHAB POTENTIAL: Excellent  CLINICAL DECISION MAKING: Stable/uncomplicated  EVALUATION COMPLEXITY: Low   GOALS: Goals reviewed with patient? Yes  LONG TERM GOALS: Target date: 06/28/24  Patient will be independent with her HEP. Baseline:  Goal status: INITIAL  2.  Patient will improve her LEFS score to at least 40/80 for improved perceived function with her daily activities. Baseline:  Goal status: INITIAL  3.  Patient will report being able to sleep without being awakened by her familiar with right hip symptoms. Baseline:  Goal status: INITIAL  4.  Patient will be able to complete her daily activities without her familiar symptoms exceeding 5/10. Baseline:  Goal status: INITIAL  Additional Long term goals will be updated as needed   PLAN:  PT FREQUENCY: 5x per week  PT DURATION: 3 weeks  PLANNED INTERVENTIONS: 97164- PT Re-evaluation, 97750- Physical Performance Testing, 97110-Therapeutic exercises, 97530- Therapeutic activity, [id number]- Neuromuscular re-education, 97535- Self Care, 02859- Manual therapy, [phone]- Gait training, [phone]- Electrical stimulation (unattended), 97016- Vasopneumatic device, Patient/Family education, Balance  training, Stair training, Joint mobilization, Cryotherapy, and Moist heat  PLAN FOR NEXT SESSION: Pain-free isometrics, gentle strengthening of hip flexors and glutes, active assisted range of motion, passive range of motion, and modalities as needed, pending surgical protocol   Estefana Jude, Student-PT 06/25/2024, 9:42 AM

## 2024-06-28 ENCOUNTER — Ambulatory Visit

## 2024-06-28 DIAGNOSIS — M6281 Muscle weakness (generalized): Secondary | ICD-10-CM

## 2024-06-28 DIAGNOSIS — R262 Difficulty in walking, not elsewhere classified: Secondary | ICD-10-CM | POA: Diagnosis not present

## 2024-06-28 DIAGNOSIS — M25551 Pain in right hip: Secondary | ICD-10-CM | POA: Diagnosis not present

## 2024-06-28 NOTE — Therapy (Signed)
 OUTPATIENT PHYSICAL THERAPY LOWER EXTREMITY TREATMENT   Patient Name: Tracy Tanner MRN: [id number] DOB:21-Jul-2001, 23 y.o., female Today's Date: 06/28/2024  END OF SESSION:  PT End of Session - 06/28/24 1345     Visit Number 6    Number of Visits 15    Date for PT Re-Evaluation 07/30/24    PT Start Time 1345    PT Stop Time 1428    PT Time Calculation (min) 43 min    Activity Tolerance Patient tolerated treatment well    Behavior During Therapy [facility] for tasks assessed/performed            History reviewed. No pertinent past medical history. History reviewed. No pertinent surgical history. Patient Active Problem List   Diagnosis Date Noted   Groin pain, right 12/04/2021   Migraine without aura and without status migrainosus, not intractable 03/26/2021   BMI 27.0-27.9,adult 03/26/2021   REFERRING PROVIDER: Kendell Aldon Missy MADISON, MD  REFERRING DIAG: Right hip pain  THERAPY DIAG:  Pain in right hip  Difficulty in walking, not elsewhere classified  Muscle weakness (generalized)  Rationale for Evaluation and Treatment: Rehabilitation  ONSET DATE: 06/02/24  SUBJECTIVE:   SUBJECTIVE STATEMENT: Patient reports that they are doing well and aren't in any pain.   PERTINENT HISTORY: Unremarkable PAIN:  Are you having pain? No  PRECAUTIONS: Other: Hip  RED FLAGS: None   WEIGHT BEARING RESTRICTIONS: Yes no lifting over 10 pounds for 4 months and no more than 20% weight bearing on RLE   FALLS:  Has patient fallen in last 6 months? 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  PATIENT SURVEYS:  LEFS  Extreme  difficulty/unable (0), Quite a bit of difficulty (1), Moderate difficulty (2), Little difficulty (3), No difficulty (4) Survey date:  06/07/24  Any of your usual work, housework or school activities 2  2. Usual hobbies, recreational or sporting activities 2  3. Getting into/out of the bath 1  4. Walking between rooms 2  5. Putting on socks/shoes 1  6. Squatting  1  7. Lifting an object, like a bag of groceries from the floor 1  8. Performing light activities around your home 2  9. Performing heavy activities around your home 0  10. Getting into/out of a car 3  11. Walking 2 blocks 3  12. Walking 1 mile 0  13. Going up/down 10 stairs (1 flight) 0  14. Standing for 1 hour 0  15.  sitting for 1 hour 3  16. Running on even ground 0  17. Running on uneven ground 0  18. Making sharp turns while running fast 0  19. Hopping  0  20. Rolling over in bed 0  Score total:  21/80     COGNITION: Overall cognitive status: Within functional limits for tasks assessed     SENSATION: Patient reports no numbness or tingling.   EDEMA:  No edema observed  PALPATION: TTP: along incision  LOWER EXTREMITY ROM: no restrictions or pain with right hip PROM  Active ROM Right eval Left eval  Hip flexion 90 120  Hip extension    Hip abduction    Hip adduction  Hip internal rotation    Hip external rotation    Knee flexion 120 140  Knee extension 0 0  Ankle dorsiflexion    Ankle plantarflexion    Ankle inversion    Ankle eversion     (Blank rows = not tested)  LOWER EXTREMITY MMT: RLE not tested due to surgical incision  MMT Right eval Left eval  Hip flexion  4/5  Hip extension    Hip abduction    Hip adduction    Hip internal rotation    Hip external rotation    Knee flexion  4+/5  Knee extension  4+/5  Ankle dorsiflexion    Ankle plantarflexion    Ankle inversion    Ankle eversion     (Blank rows = not tested)  LOWER EXTREMITY SPECIAL TESTS:  Not tested due to surgical  incision  GAIT: Assistive device utilized: Crutches Level of assistance: Modified independence Comments: PWB on RLE                                                                                                                                 TREATMENT DATE:   06/28/2024                                    EXERCISE LOG  Exercise Repetitions and Resistance Comments  Recumbent Bike L1 x15 mins   Clamshells 3x45s w/yellow tb   Reverse Clamshells 3x45s w/ yellow tb    Prone IR/ER  3x10 w/red tb   Supine Knee to Chest 4 mins   SLR 2x10 w/ 4#   Hooklying ABD 2x10 w/ red tb    Blank cell = exercise not performed today   06/25/24                                    EXERCISE LOG  Exercise Repetitions and Resistance Comments  Recumbent Bike L1x15 minutes   Supine heel slide 2x60s   Sidelying hip ADD 2x60s   Clamshells 3x60s   SLR 2x10   Prone IR/ER 2x10 each Therapist resistance to tolerance  Bird dogs sitting 2x30s     Blank cell = exercise not performed today                                    06/17/24 EXERCISE LOG  Exercise Repetitions and Resistance Comments  Recumbent bike  L1 x 15 minutes    Supine heel slide 30 reps    Supine hip ADD isometric 3 minutes w/ 5 second hold   Supine hip ABD isometric  2.5 minutes w/ 5 second hold  With therapist assistance  Resisted hip IR/ER  Red t-band x 30 reps each  Blank cell = exercise not performed today   PATIENT EDUCATION:  Education details: HEP, precuations, and surgical protocol  Person educated: Patient Education method: Explanation Education comprehension: verbalized understanding  HOME EXERCISE PROGRAM: [id number] on 06/14/24  ASSESSMENT:  CLINICAL IMPRESSION:  Patient reports for visit 6 progressing through protocol very well. Progressed patient today with strengthening of hips and glutes with resistance. Verbal cueing required to maintain proper body mechanics through supine exercises today. Patient  exhibits weakness in ER compared to IR, however has full range of motion in all planes. Patient educated on maintaining precautions until she sees the surgeon on 07/06/2024. She continues to require skilled physical therapy to address her remaining impairments to return to her prior level of function.    OBJECTIVE IMPAIRMENTS: Abnormal gait, decreased activity tolerance, decreased mobility, difficulty walking, decreased ROM, decreased strength, and pain.   ACTIVITY LIMITATIONS: lifting, standing, squatting, sleeping, stairs, and locomotion level  PARTICIPATION LIMITATIONS: community activity and occupation  PERSONAL FACTORS: No additional personal factors are affecting patient's functional outcome.   REHAB POTENTIAL: Excellent  CLINICAL DECISION MAKING: Stable/uncomplicated  EVALUATION COMPLEXITY: Low   GOALS: Goals reviewed with patient? Yes  LONG TERM GOALS: Target date: 06/28/24  Patient will be independent with her HEP. Baseline:  Goal status: INITIAL  2.  Patient will improve her LEFS score to at least 40/80 for improved perceived function with her daily activities. Baseline:  Goal status: INITIAL  3.  Patient will report being able to sleep without being awakened by her familiar with right hip symptoms. Baseline:  Goal status: INITIAL  4.  Patient will be able to complete her daily activities without her familiar symptoms exceeding 5/10. Baseline:  Goal status: INITIAL  Additional Long term goals will be updated as needed   PLAN:  PT FREQUENCY: 5x per week  PT DURATION: 3 weeks  PLANNED INTERVENTIONS: 97164- PT Re-evaluation, 97750- Physical Performance Testing, 97110-Therapeutic exercises, 97530- Therapeutic activity, [id number]- Neuromuscular re-education, 97535- Self Care, 02859- Manual therapy, [id number]- Gait training, [phone]- Electrical stimulation (unattended), 97016- Vasopneumatic device, Patient/Family education, Balance training, Stair training, Joint mobilization,  Cryotherapy, and Moist heat  PLAN FOR NEXT SESSION: Pain-free resistance training and strengthening, and modalities as needed, pending surgical protocol for WB precautions.   Estefana Jude, Student-PT 06/28/2024, 2:38 PM

## 2024-07-06 DIAGNOSIS — M25551 Pain in right hip: Secondary | ICD-10-CM | POA: Diagnosis not present

## 2024-07-06 NOTE — Progress Notes (Addendum)
 Chief Complaint: Status post right hip arthroscopy, 06/02/2024  Subjective: Tracy Tanner follows up today and is doing well.  Physical Examination:  Musculoskeletal:  Examination of the right hip demonstrates terminal flexion  of 105 degrees. Internal rotation at 90 degrees is 10 degrees. Faber's test  is 2 fists.  Radiology: 3 views of the right hip are evaluated. These are stable.  Assessment: Status post right hip arthroscopy on 06/02/2024.  Plan: I am pleased with the early clinical and radiographic progress. Follow up for a clinical check in 3 months.

## 2024-07-08 ENCOUNTER — Ambulatory Visit

## 2024-07-14 ENCOUNTER — Ambulatory Visit: Attending: Orthopedic Surgery

## 2024-07-14 DIAGNOSIS — R262 Difficulty in walking, not elsewhere classified: Secondary | ICD-10-CM | POA: Insufficient documentation

## 2024-07-14 DIAGNOSIS — M6281 Muscle weakness (generalized): Secondary | ICD-10-CM | POA: Diagnosis not present

## 2024-07-14 DIAGNOSIS — M25551 Pain in right hip: Secondary | ICD-10-CM | POA: Diagnosis not present

## 2024-07-14 NOTE — Therapy (Signed)
 OUTPATIENT PHYSICAL THERAPY LOWER EXTREMITY TREATMENT   Patient Name: Tracy Tanner MRN: [id number] DOB:2001-06-21, 23 y.o., female Today's Date: 07/14/2024  END OF SESSION:  PT End of Session - 07/14/24 0852     Visit Number 7    Number of Visits 15    Date for PT Re-Evaluation 07/30/24    PT Start Time 0845    PT Stop Time 0930    PT Time Calculation (min) 45 min    Activity Tolerance Patient tolerated treatment well    Behavior During Therapy [facility] for tasks assessed/performed            History reviewed. No pertinent past medical history. History reviewed. No pertinent surgical history. Patient Active Problem List   Diagnosis Date Noted   Groin pain, right 12/04/2021   Migraine without aura and without status migrainosus, not intractable 03/26/2021   BMI 27.0-27.9,adult 03/26/2021   REFERRING PROVIDER: Kendell Aldon Missy MADISON, MD  REFERRING DIAG: Right hip pain  THERAPY DIAG:  Pain in right hip  Rationale for Evaluation and Treatment: Rehabilitation  ONSET DATE: 06/02/24  SUBJECTIVE:   SUBJECTIVE STATEMENT: Patient states they saw their surgeon on August 5th and they said she was clear to remove the crutches and brace. Patient states she hasn't been having any pain, just some soreness in the hip after standing at work.   PERTINENT HISTORY: Unremarkable PAIN:  Are you having pain? No  PRECAUTIONS: Other: Hip  RED FLAGS: None   WEIGHT BEARING RESTRICTIONS: Yes no lifting over 10 pounds for 4 months and no more than 20% weight bearing on RLE   FALLS:  Has patient fallen in last 6 months? 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  PATIENT SURVEYS:  LEFS  Extreme difficulty/unable (0), Quite a bit of difficulty (1), Moderate difficulty (2), Little difficulty (3), No difficulty (4) Survey date:  06/07/24  Any of your usual work, housework or school activities 2  2. Usual hobbies, recreational or sporting activities 2  3. Getting into/out of the bath 1  4. Walking between rooms 2  5. Putting on socks/shoes 1  6. Squatting  1  7. Lifting an object, like a bag of groceries from the floor 1  8. Performing light activities around your home 2  9. Performing heavy activities around your home 0  10. Getting into/out of a car 3  11. Walking 2 blocks 3  12. Walking 1 mile 0  13. Going up/down 10 stairs (1 flight) 0  14. Standing for 1 hour 0  15.  sitting for 1 hour 3  16. Running on even ground 0  17. Running on uneven ground 0  18. Making sharp turns while running fast 0  19. Hopping  0  20. Rolling over in bed 0  Score total:  21/80     COGNITION: Overall cognitive status: Within functional limits for tasks assessed     SENSATION: Patient reports no numbness or tingling.   EDEMA:  No edema observed  PALPATION: TTP: along incision  LOWER EXTREMITY ROM: no restrictions or pain with right hip PROM  Active ROM Right eval Left eval  Hip  flexion 90 120  Hip extension    Hip abduction    Hip adduction    Hip internal rotation    Hip external rotation    Knee flexion 120 140  Knee extension 0 0  Ankle dorsiflexion    Ankle plantarflexion    Ankle inversion    Ankle eversion     (Blank rows = not tested)  LOWER EXTREMITY MMT:   MMT Right eval Left eval Right  8/13  Hip flexion  4/5 4  Hip extension   5  Hip abduction   4  Hip adduction     Hip internal rotation     Hip external rotation     Knee flexion  4+/5 5  Knee extension  4+/5 5  Ankle dorsiflexion   5  Ankle plantarflexion   5  Ankle inversion     Ankle eversion      (Blank rows = not  tested)  LOWER EXTREMITY SPECIAL TESTS:  Not tested due to surgical incision  GAIT: Assistive device utilized: Crutches Level of assistance: Modified independence Comments: PWB on RLE                                                                                                                                 TREATMENT DATE:  07/14/2024                                    EXERCISE LOG  Exercise Repetitions and Resistance Comments  Recumbent Bike L2 x15 mins   Hip IR/ER 2x15 w/ green tb ea   SLS  6x 30s airex    Tandem stance  Airex w/ 2# ball throws 3 mins   Bosu squats  2x10  Both legs, bosu flipped  Bosu Lunges 2x10 ea side    Standing hip abd 3x10 green tb   Glute bridges w/hip abd 3x8 w/green tb   Glute bridges w/ hip add 3x8 w/ball     Blank cell = exercise not performed today   06/28/2024                                    EXERCISE LOG  Exercise Repetitions and Resistance Comments  Recumbent Bike L1 x15 mins   Clamshells 3x45s w/yellow tb   Reverse Clamshells 3x45s w/ yellow tb    Prone IR/ER  3x10 w/red tb   Supine Knee to Chest 4 mins   SLR 2x10 w/ 4#   Hooklying ABD 2x10 w/ red tb    Blank cell = exercise not performed today    PATIENT EDUCATION:  Education details: HEP, precuations, and surgical protocol  Person educated: Patient Education method: Explanation Education comprehension: verbalized understanding  HOME EXERCISE PROGRAM: [id number] on 06/14/24  ASSESSMENT:  CLINICAL IMPRESSION:  Patient reports for visit 7 today doing very well with no pain, but some soreness from returning to work this past week. Progressed patient with strengthening today including single leg stance, glute bridging, and hip abduction work. Patient tolerated all strengthening exercises today well with no pain. Patient reported enjoying the exercises today and is progressing well through protocol. Patient is no longer wearing the brace or using crutches and has no ROM  restrictions. Plan to continue challenging strengthening in all planes. She continues to require skilled physical therapy to address her remaining impairments to return to her prior level of function.    OBJECTIVE IMPAIRMENTS: Abnormal gait, decreased activity tolerance, decreased mobility, difficulty walking, decreased ROM, decreased strength, and pain.   ACTIVITY LIMITATIONS: lifting, standing, squatting, sleeping, stairs, and locomotion level  PARTICIPATION LIMITATIONS: community activity and occupation  PERSONAL FACTORS: No additional personal factors are affecting patient's functional outcome.   REHAB POTENTIAL: Excellent  CLINICAL DECISION MAKING: Stable/uncomplicated  EVALUATION COMPLEXITY: Low   GOALS: Goals reviewed with patient? Yes  LONG TERM GOALS: Target date: 06/28/24  Patient will be independent with her HEP. Baseline:  Goal status: INITIAL  2.  Patient will improve her LEFS score to at least 40/80 for improved perceived function with her daily activities. Baseline:  Goal status: INITIAL  3.  Patient will report being able to sleep without being awakened by her familiar with right hip symptoms. Baseline:  Goal status: INITIAL  4.  Patient will be able to complete her daily activities without her familiar symptoms exceeding 5/10. Baseline:  Goal status: INITIAL  Additional Long term goals will be updated as needed   PLAN:  PT FREQUENCY: 5x per week  PT DURATION: 3 weeks  PLANNED INTERVENTIONS: 97164- PT Re-evaluation, 97750- Physical Performance Testing, 97110-Therapeutic exercises, 97530- Therapeutic activity, [id number]- Neuromuscular re-education, 97535- Self Care, [id number]- Manual therapy, [id number]- Gait training, [phone]- Electrical stimulation (unattended), 97016- Vasopneumatic device, Patient/Family education, Balance training, Stair training, Joint mobilization, Cryotherapy, and Moist heat  PLAN FOR NEXT SESSION: Pain-free resistance training and  strengthening, and modalities as needed, pending surgical protocol for WB precautions.   Estefana Jude, Student-PT 07/14/2024, 10:26 AM

## 2024-07-21 ENCOUNTER — Encounter

## 2024-07-23 ENCOUNTER — Ambulatory Visit

## 2024-07-23 DIAGNOSIS — M25551 Pain in right hip: Secondary | ICD-10-CM | POA: Diagnosis not present

## 2024-07-23 DIAGNOSIS — R262 Difficulty in walking, not elsewhere classified: Secondary | ICD-10-CM

## 2024-07-23 DIAGNOSIS — M6281 Muscle weakness (generalized): Secondary | ICD-10-CM | POA: Diagnosis not present

## 2024-07-23 NOTE — Therapy (Signed)
 OUTPATIENT PHYSICAL THERAPY LOWER EXTREMITY TREATMENT   Patient Name: Tracy Tanner MRN: [id number] DOB:February 06, 2001, 23 y.o., female Today's Date: 07/23/2024  END OF SESSION:  PT End of Session - 07/23/24 0805     Visit Number 8    Number of Visits 15    Date for PT Re-Evaluation 07/30/24    PT Start Time 0800    PT Stop Time 0845    PT Time Calculation (min) 45 min    Activity Tolerance Patient tolerated treatment well    Behavior During Therapy [facility] for tasks assessed/performed            History reviewed. No pertinent past medical history. History reviewed. No pertinent surgical history. Patient Active Problem List   Diagnosis Date Noted   Groin pain, right 12/04/2021   Migraine without aura and without status migrainosus, not intractable 03/26/2021   BMI 27.0-27.9,adult 03/26/2021   REFERRING PROVIDER: Kendell Aldon Missy MADISON, MD  REFERRING DIAG: Right hip pain  THERAPY DIAG:  Pain in right hip  Difficulty in walking, not elsewhere classified  Muscle weakness (generalized)  Rationale for Evaluation and Treatment: Rehabilitation  ONSET DATE: 06/02/24  SUBJECTIVE:   SUBJECTIVE STATEMENT: Patient states they started school this week and is feeling a little bit more sore form all the walking. She states she thinks she just over did it.   PERTINENT HISTORY: Unremarkable PAIN:  Are you having pain? No  PRECAUTIONS: Other: Hip  RED FLAGS: None   WEIGHT BEARING RESTRICTIONS: Yes no lifting over 10 pounds for 4 months and no more than 20% weight bearing on RLE   FALLS:  Has patient fallen in last 6 months? 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  PATIENT SURVEYS:  LEFS  Extreme difficulty/unable (0), Quite a bit of difficulty (1), Moderate difficulty (2), Little difficulty (3), No difficulty (4) Survey date:  06/07/24  Any of your usual work, housework or school activities 2  2. Usual hobbies, recreational or sporting activities 2  3. Getting into/out of the bath 1  4. Walking between rooms 2  5. Putting on socks/shoes 1  6. Squatting  1  7. Lifting an object, like a bag of groceries from the floor 1  8. Performing light activities around your home 2  9. Performing heavy activities around your home 0  10. Getting into/out of a car 3  11. Walking 2 blocks 3  12. Walking 1 mile 0  13. Going up/down 10 stairs (1 flight) 0  14. Standing for 1 hour 0  15.  sitting for 1 hour 3  16. Running on even ground 0  17. Running on uneven ground 0  18. Making sharp turns while running fast 0  19. Hopping  0  20. Rolling over in bed 0  Score total:  21/80     COGNITION: Overall cognitive status: Within functional limits for tasks assessed     SENSATION: Patient reports no numbness or tingling.   EDEMA:  No edema observed  PALPATION: TTP: along incision  LOWER EXTREMITY ROM: no restrictions or pain with right hip PROM  Active ROM Right eval Left eval  Hip  flexion 90 120  Hip extension    Hip abduction    Hip adduction    Hip internal rotation    Hip external rotation    Knee flexion 120 140  Knee extension 0 0  Ankle dorsiflexion    Ankle plantarflexion    Ankle inversion    Ankle eversion     (Blank rows = not tested)  LOWER EXTREMITY MMT:   MMT Right eval Left eval Right  8/13  Hip flexion  4/5 4  Hip extension   5  Hip abduction   4  Hip adduction     Hip internal rotation     Hip external rotation     Knee flexion  4+/5 5  Knee extension  4+/5 5  Ankle dorsiflexion   5  Ankle plantarflexion   5  Ankle inversion     Ankle eversion      (Blank rows =  not tested)  LOWER EXTREMITY SPECIAL TESTS:  Not tested due to surgical incision  GAIT: Assistive device utilized: Crutches Level of assistance: Modified independence Comments: PWB on RLE                                                                                                                                 TREATMENT DATE:   07/23/2024                                    EXERCISE LOG  Exercise Repetitions and Resistance Comments  Recumbent Bike L3x15 mins   Glute bridges w/ hip abd  2x15 green tb   Glute bridges w/ hip add  2x15 w/ball   SL glute bridge 2x10    Hip IR/ER 2x15 w/ green tb   Standing hip abd X15 with green tb   Walking squats 6 laps w/ green tb   Squats w/ bosu reverse 2x10   Hip 3 way on bosu  1x10 ea. Leg    SLS On airex 2 min    Tandem stance  On airex 1 min ea leg    Blank cell = exercise not performed today   07/14/2024                                    EXERCISE LOG  Exercise Repetitions and Resistance Comments  Recumbent Bike L2 x15 mins   Hip IR/ER 2x15 w/ green tb ea   SLS  6x 30s airex    Tandem stance  Airex w/ 2# ball throws 3 mins   Bosu squats  2x10  Both legs, bosu flipped  Bosu Lunges 2x10 ea side    Standing hip abd 3x10 green tb   Glute bridges w/hip abd 3x8 w/green tb   Glute bridges w/ hip add 3x8 w/ball  Blank cell = exercise not performed today   06/28/2024                                    EXERCISE LOG  Exercise Repetitions and Resistance Comments  Recumbent Bike L1 x15 mins   Clamshells 3x45s w/yellow tb   Reverse Clamshells 3x45s w/ yellow tb    Prone IR/ER  3x10 w/red tb   Supine Knee to Chest 4 mins   SLR 2x10 w/ 4#   Hooklying ABD 2x10 w/ red tb    Blank cell = exercise not performed today    PATIENT EDUCATION:  Education details: HEP, precuations, and surgical protocol  Person educated: Patient Education method: Explanation Education comprehension: verbalized understanding  HOME EXERCISE  PROGRAM: [id number] on 06/14/24  ASSESSMENT:  CLINICAL IMPRESSION: Patient reports today stating they are a little sore from starting school this week and walking around a lot. Patient progressed today with hip strengthening and challenging functional activities. Patient required BUE support for balance activities today including hip work on the bosu ball. Patient states that exercises are still challenging specifically with balance components due to hip weakness. Plan to increase intensity of future sessions to patient tolerance. Patient continues to require physical therapy to address impairments below.  OBJECTIVE IMPAIRMENTS: Abnormal gait, decreased activity tolerance, decreased mobility, difficulty walking, decreased ROM, decreased strength, and pain.   ACTIVITY LIMITATIONS: lifting, standing, squatting, sleeping, stairs, and locomotion level  PARTICIPATION LIMITATIONS: community activity and occupation  PERSONAL FACTORS: No additional personal factors are affecting patient's functional outcome.   REHAB POTENTIAL: Excellent  CLINICAL DECISION MAKING: Stable/uncomplicated  EVALUATION COMPLEXITY: Low   GOALS: Goals reviewed with patient? Yes  LONG TERM GOALS: Target date: 06/28/24  Patient will be independent with her HEP. Baseline:  Goal status: INITIAL  2.  Patient will improve her LEFS score to at least 40/80 for improved perceived function with her daily activities. Baseline:  Goal status: INITIAL  3.  Patient will report being able to sleep without being awakened by her familiar with right hip symptoms. Baseline:  Goal status: INITIAL  4.  Patient will be able to complete her daily activities without her familiar symptoms exceeding 5/10. Baseline:  Goal status: INITIAL  Additional Long term goals will be updated as needed   PLAN:  PT FREQUENCY: 5x per week  PT DURATION: 3 weeks  PLANNED INTERVENTIONS: 97164- PT Re-evaluation, 97750- Physical  Performance Testing, 97110-Therapeutic exercises, 97530- Therapeutic activity, [id number]- Neuromuscular re-education, 97535- Self Care, 02859- Manual therapy, [id number]- Gait training, [phone]- Electrical stimulation (unattended), 97016- Vasopneumatic device, Patient/Family education, Balance training, Stair training, Joint mobilization, Cryotherapy, and Moist heat  PLAN FOR NEXT SESSION: Pain-free resistance training and strengthening, and modalities as needed, pending surgical protocol for WB precautions.   Estefana Jude, Student-PT 07/23/2024, 8:53 AM

## 2024-07-29 ENCOUNTER — Encounter

## 2024-08-26 DIAGNOSIS — R300 Dysuria: Secondary | ICD-10-CM | POA: Diagnosis not present

## 2024-08-26 DIAGNOSIS — Z7251 High risk heterosexual behavior: Secondary | ICD-10-CM | POA: Diagnosis not present

## 2024-10-26 DIAGNOSIS — M25551 Pain in right hip: Secondary | ICD-10-CM | POA: Diagnosis not present

## 2024-11-10 DIAGNOSIS — N200 Calculus of kidney: Secondary | ICD-10-CM | POA: Diagnosis not present

## 2024-11-10 DIAGNOSIS — R300 Dysuria: Secondary | ICD-10-CM | POA: Diagnosis not present

## 2024-11-10 DIAGNOSIS — Z3202 Encounter for pregnancy test, result negative: Secondary | ICD-10-CM | POA: Diagnosis not present
